# Patient Record
Sex: Female | Born: 1975
Health system: Southern US, Community
[De-identification: ages and names within clinical notes are randomized; demographics above are authoritative.]

## PROBLEM LIST (undated history)

## (undated) DIAGNOSIS — Z8481 Family history of carrier of genetic disease: Secondary | ICD-10-CM

## (undated) DIAGNOSIS — Z800 Family history of malignant neoplasm of digestive organs: Secondary | ICD-10-CM

## (undated) DIAGNOSIS — Z803 Family history of malignant neoplasm of breast: Secondary | ICD-10-CM

## (undated) DIAGNOSIS — Z8042 Family history of malignant neoplasm of prostate: Secondary | ICD-10-CM

## (undated) DIAGNOSIS — Z1501 Genetic susceptibility to malignant neoplasm of breast: Secondary | ICD-10-CM

## (undated) DIAGNOSIS — J189 Pneumonia, unspecified organism: Secondary | ICD-10-CM

## (undated) DIAGNOSIS — R112 Nausea with vomiting, unspecified: Secondary | ICD-10-CM

## (undated) DIAGNOSIS — K219 Gastro-esophageal reflux disease without esophagitis: Secondary | ICD-10-CM

## (undated) DIAGNOSIS — Z9889 Other specified postprocedural states: Secondary | ICD-10-CM

## (undated) DIAGNOSIS — Z973 Presence of spectacles and contact lenses: Secondary | ICD-10-CM

## (undated) HISTORY — DX: Family history of carrier of genetic disease: Z84.81

## (undated) HISTORY — DX: Family history of malignant neoplasm of breast: Z80.3

## (undated) HISTORY — DX: Genetic susceptibility to malignant neoplasm of breast: Z15.01

## (undated) HISTORY — DX: Family history of malignant neoplasm of digestive organs: Z80.0

## (undated) HISTORY — DX: Family history of malignant neoplasm of prostate: Z80.42

---

## 1998-10-04 ENCOUNTER — Encounter: Admission: RE | Admit: 1998-10-04 | Discharge: 1998-11-01 | Payer: Self-pay | Admitting: Family Medicine

## 1998-11-04 ENCOUNTER — Encounter: Admission: RE | Admit: 1998-11-04 | Discharge: 1999-02-02 | Payer: Self-pay | Admitting: *Deleted

## 1998-11-22 ENCOUNTER — Emergency Department (HOSPITAL_COMMUNITY): Admission: EM | Admit: 1998-11-22 | Discharge: 1998-11-23 | Payer: Self-pay | Admitting: Emergency Medicine

## 1998-11-23 ENCOUNTER — Encounter: Payer: Self-pay | Admitting: Emergency Medicine

## 1999-02-02 ENCOUNTER — Other Ambulatory Visit: Admission: RE | Admit: 1999-02-02 | Discharge: 1999-02-02 | Payer: Self-pay | Admitting: Obstetrics

## 1999-02-02 ENCOUNTER — Inpatient Hospital Stay (HOSPITAL_COMMUNITY): Admission: AD | Admit: 1999-02-02 | Discharge: 1999-02-02 | Payer: Self-pay | Admitting: Obstetrics

## 1999-02-16 ENCOUNTER — Inpatient Hospital Stay (HOSPITAL_COMMUNITY): Admission: AD | Admit: 1999-02-16 | Discharge: 1999-02-16 | Payer: Self-pay | Admitting: *Deleted

## 1999-03-23 ENCOUNTER — Observation Stay (HOSPITAL_COMMUNITY): Admission: AD | Admit: 1999-03-23 | Discharge: 1999-03-24 | Payer: Self-pay | Admitting: Obstetrics

## 1999-03-23 ENCOUNTER — Encounter: Payer: Self-pay | Admitting: Obstetrics

## 1999-04-12 ENCOUNTER — Inpatient Hospital Stay (HOSPITAL_COMMUNITY): Admission: AD | Admit: 1999-04-12 | Discharge: 1999-04-12 | Payer: Self-pay | Admitting: Obstetrics

## 1999-04-27 ENCOUNTER — Inpatient Hospital Stay (HOSPITAL_COMMUNITY): Admission: AD | Admit: 1999-04-27 | Discharge: 1999-04-27 | Payer: Self-pay | Admitting: Obstetrics

## 1999-05-08 ENCOUNTER — Inpatient Hospital Stay (HOSPITAL_COMMUNITY): Admission: AD | Admit: 1999-05-08 | Discharge: 1999-05-08 | Payer: Self-pay | Admitting: Obstetrics

## 1999-05-10 ENCOUNTER — Inpatient Hospital Stay (HOSPITAL_COMMUNITY): Admission: AD | Admit: 1999-05-10 | Discharge: 1999-05-10 | Payer: Self-pay | Admitting: Obstetrics

## 1999-05-19 ENCOUNTER — Inpatient Hospital Stay (HOSPITAL_COMMUNITY): Admission: AD | Admit: 1999-05-19 | Discharge: 1999-05-21 | Payer: Self-pay | Admitting: Obstetrics

## 1999-07-28 ENCOUNTER — Encounter: Admission: RE | Admit: 1999-07-28 | Discharge: 1999-10-26 | Payer: Self-pay | Admitting: Family Medicine

## 1999-09-05 ENCOUNTER — Encounter: Payer: Self-pay | Admitting: Family Medicine

## 1999-09-05 ENCOUNTER — Ambulatory Visit (HOSPITAL_COMMUNITY): Admission: RE | Admit: 1999-09-05 | Discharge: 1999-09-05 | Payer: Self-pay | Admitting: Family Medicine

## 2000-03-11 ENCOUNTER — Other Ambulatory Visit: Admission: RE | Admit: 2000-03-11 | Discharge: 2000-03-11 | Payer: Self-pay | Admitting: Obstetrics

## 2000-04-12 ENCOUNTER — Encounter: Payer: Self-pay | Admitting: *Deleted

## 2000-04-12 ENCOUNTER — Inpatient Hospital Stay (HOSPITAL_COMMUNITY): Admission: EM | Admit: 2000-04-12 | Discharge: 2000-04-12 | Payer: Self-pay | Admitting: Obstetrics

## 2000-04-12 ENCOUNTER — Ambulatory Visit (HOSPITAL_COMMUNITY): Admission: RE | Admit: 2000-04-12 | Discharge: 2000-04-12 | Payer: Self-pay | Admitting: *Deleted

## 2000-05-10 ENCOUNTER — Encounter: Payer: Self-pay | Admitting: Obstetrics

## 2000-05-10 ENCOUNTER — Inpatient Hospital Stay (HOSPITAL_COMMUNITY): Admission: AD | Admit: 2000-05-10 | Discharge: 2000-05-10 | Payer: Self-pay | Admitting: Obstetrics

## 2000-05-25 ENCOUNTER — Observation Stay (HOSPITAL_COMMUNITY): Admission: AD | Admit: 2000-05-25 | Discharge: 2000-05-26 | Payer: Self-pay | Admitting: Obstetrics

## 2000-09-26 ENCOUNTER — Inpatient Hospital Stay (HOSPITAL_COMMUNITY): Admission: AD | Admit: 2000-09-26 | Discharge: 2000-09-26 | Payer: Self-pay | Admitting: Obstetrics

## 2001-02-07 ENCOUNTER — Encounter: Admission: RE | Admit: 2001-02-07 | Discharge: 2001-05-08 | Payer: Self-pay | Admitting: Family Medicine

## 2001-04-11 ENCOUNTER — Encounter: Payer: Self-pay | Admitting: Family Medicine

## 2001-04-11 ENCOUNTER — Encounter: Admission: RE | Admit: 2001-04-11 | Discharge: 2001-04-11 | Payer: Self-pay | Admitting: Family Medicine

## 2001-05-06 ENCOUNTER — Other Ambulatory Visit: Admission: RE | Admit: 2001-05-06 | Discharge: 2001-05-06 | Payer: Self-pay | Admitting: Family Medicine

## 2001-06-13 ENCOUNTER — Encounter: Admission: RE | Admit: 2001-06-13 | Discharge: 2001-09-11 | Payer: Self-pay | Admitting: Family Medicine

## 2019-08-28 DIAGNOSIS — C801 Malignant (primary) neoplasm, unspecified: Secondary | ICD-10-CM

## 2019-08-28 HISTORY — DX: Malignant (primary) neoplasm, unspecified: C80.1

## 2020-04-18 ENCOUNTER — Telehealth: Payer: Self-pay | Admitting: Genetic Counselor

## 2020-04-18 NOTE — Telephone Encounter (Signed)
Received a genetic counseling referral from Dr. Donne Hazel for fhx of brca and brca1+. Pamela [name] has been cld and scheduled for a mychart video visit on 9/2 at [place] link has been sent to the pt to sign up for mychart in order to access the link to the visit.

## 2020-04-28 ENCOUNTER — Inpatient Hospital Stay: Payer: Self-pay | Attending: General Surgery | Admitting: Genetic Counselor

## 2020-04-28 ENCOUNTER — Encounter: Payer: Self-pay | Admitting: Genetic Counselor

## 2020-04-28 DIAGNOSIS — Z803 Family history of malignant neoplasm of breast: Secondary | ICD-10-CM

## 2020-04-28 DIAGNOSIS — Z8481 Family history of carrier of genetic disease: Secondary | ICD-10-CM

## 2020-04-28 DIAGNOSIS — Z1509 Genetic susceptibility to other malignant neoplasm: Secondary | ICD-10-CM

## 2020-04-28 DIAGNOSIS — Z1501 Genetic susceptibility to malignant neoplasm of breast: Secondary | ICD-10-CM

## 2020-04-28 DIAGNOSIS — Z800 Family history of malignant neoplasm of digestive organs: Secondary | ICD-10-CM

## 2020-04-28 DIAGNOSIS — Z8042 Family history of malignant neoplasm of prostate: Secondary | ICD-10-CM

## 2020-04-28 DIAGNOSIS — Z1502 Genetic susceptibility to malignant neoplasm of ovary: Secondary | ICD-10-CM

## 2020-04-28 NOTE — Progress Notes (Signed)
REFERRING PROVIDER: Rolm Bookbinder, MD [address] [place],  [place] [address]  PRIMARY PROVIDER:  No primary care provider on file.  PRIMARY REASON FOR VISIT:  1. 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 Family history of BRCA1 gene positive   3. Family history of breast cancer   4. Family history of prostate cancer   5. Family history of colon cancer      I connected with Pamela [name] on 04/28/2020 at 10:00 am EDT by video conference and verified that I am speaking with the correct person using two identifiers.   Patient location: Car (not driving) Provider location: [facility] office  HISTORY OF PRESENT ILLNESS:   Pamela [name], a 44 y.o. female, was seen for a [facility] cancer genetics consultation at the request of Dr. Donne Hazel due to a personal and family history of BRCA1 positive genetic testing.  Pamela [name] presents to clinic today to discuss her genetic test results, and to further clarify her future cancer risks as well as potential cancer risks for family members.   Pamela [name] does not have a personal history of cancer. In August of 2021, she underwent genetic testing through Dr. Donne Hazel for the known familial BRCA1 variant.  RISK FACTORS:  Menarche was at age 53-13.  First live birth at age 23.  OCP use for approximately 5 years.  Ovaries intact: yes.  Hysterectomy: no.  Menopausal status: perimenopausal.  HRT use: 0 years. Colonoscopy: n/a. Mammogram within the last year: yes. Number of breast biopsies: 2 - benign. Up to date with pelvic exams: yes. Any excessive radiation exposure in the past: no   Past Medical History:  Diagnosis Date  . 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 Family history of BRCA1 gene positive   . Family history of breast cancer   . Family history of colon cancer   . Family history of prostate cancer     Social History   Socioeconomic History  . Marital status: Not on file    Spouse name: Not on file  . Number of children:  Not on file  . Years of education: Not on file  . Highest education level: Not on file  Occupational History  . Not on file  Tobacco Use  . Smoking status: Not on file  Substance and Sexual Activity  . Alcohol use: Not on file  . Drug use: Not on file  . Sexual activity: Not on file  Other Topics Concern  . Not on file  Social History Narrative  . Not on file   Social Determinants of Health   Financial Resource Strain:   . Difficulty of Paying Living Expenses: Not on file  Food Insecurity:   . Worried About Charity fundraiser in the Last Year: Not on file  . Ran Out of Food in the Last Year: Not on file  Transportation Needs:   . Lack of Transportation (Medical): Not on file  . Lack of Transportation (Non-Medical): Not on file  Physical Activity:   . Days of Exercise per Week: Not on file  . Minutes of Exercise per Session: Not on file  Stress:   . Feeling of Stress : Not on file  Social Connections:   . Frequency of Communication with Friends and Family: Not on file  . Frequency of Social Gatherings with Friends and Family: Not on file  . Attends Religious Services: Not on file  . Active Member of Clubs or Organizations:  Not on file  . Attends Archivist Meetings: Not on file  . Marital Status: Not on file     FAMILY HISTORY:  We obtained a detailed, 4-generation family history.  Significant diagnoses are listed below: Family History  Problem Relation Age of Onset  . Breast cancer Mother        dx. in her 42s  . Breast cancer Sister 14       BRCA1 positive: c.213-11T>G (Intronic)  . Cancer Maternal Aunt        unconfirmed, patient suspects that she died from cancer younger than 7  . Breast cancer Maternal Grandmother        bilateral  . Cancer Maternal Grandmother        gynecologic  . Prostate cancer Maternal Uncle        dx. in his 35s  . Colon cancer Paternal Aunt        died in her early 39s  . Breast cancer Cousin 28       maternal first  cousin  . Breast cancer Cousin        dx. in her 40s, paternal first cousin   Pamela [name] has two sons (ages 47 and 87) and one daughter (age 9). She has one sister, Pamela [name], who was diagnosed with breast cancer earlier this year at the age of 12 and subsequently had positive genetic testing for a mutation in the BRCA1 gene.   Pamela [name] mother is 37 and was diagnosed with breast cancer in her 81s. She believes her mother had a complete hysterectomy. Pamela [name] had one maternal aunt and three maternal uncles. Her aunt died when she was younger than 80, and Pamela [name] believes that she likely had cancer and did not disclose the diagnosis to her family. This aunt had a daughter who was diagnosed with breast cancer at age 59. One of Pamela [name]'s maternal uncles was diagnosed with prostate cancer in his 94s. Her maternal grandmother had a history of bilateral breast cancer and a gynecologic cancer, diagnosed at unknown ages. Her maternal grandfather died when he was younger than 89.   Pamela [name] father is 29 and has not had cancer. Pamela [name] had two paternal aunts. One aunt is in her 61s and has not had cancer, and the other aunt died in her 83s from colon cancer. She has a paternal first cousin who was diagnosed with breast cancer in her 28s. Her paternal grandmother died at the age of 66, and her paternal grandfather died at an unknown age.  Pamela [name] is aware of previous family history of genetic testing for hereditary cancer risks. Patient's maternal ancestors are of Black and Native American descent, and paternal ancestors are of Native [place], [place], and Black descent. There is no reported Ashkenazi Jewish ancestry. There is no known consanguinity.  GENETIC TESTING:  Ms. Stamant pursued genetic testing for the specific BRCA1 mutation that was identified in the family through Dr. Donne Hazel. Genetic testing reported on 04/12/2020 through [facility]. A single, heterozygous pathogenic  variant was detected in the BRCA1 gene called c.213-11T>G (Intronic).   The test report will be scanned into EPIC and located under the Molecular Pathology section of the Results Review tab.  A portion of the result report is included below for reference.     CANCER RISKS: Studies show that women with a BRCA1 mutation can have a 65-79% lifetime risk to develop breast cancer, a 40% lifetime risk to develop a  secondary breast cancer, and up to a 36-53% risk to develop ovarian cancer. Men have a 1-2% lifetime risk to develop female breast cancer and an increased risk for prostate cancer. Both men and women can also have a 2-3% increased risk for pancreatic cancer.  CANCER RISK REDUCTION & SCREENING RECOMMENDATIONS: The [place] (NCCN) recommends the following for women who carry BRCA mutations: . Breast awareness starting at the age of 73 years; women should report any changes to their breasts to their health care provider. Periodic breast self-exams may facilitate breast self-awareness. . Clinical breast exams every 6-12 months, starting at age 45 years . Annual breast MRI and annual mammograms starting at age 4 years and 30 years, respectively, or individualized based on age of earliest onset of breast cancer in the family. . Consideration of risk reducing mastectomy, which reduces the risk of breast cancer by greater than 90%. . Consideration of risk reducing salpingo-oophorectomy (RRSO), ideally between the ages of 74-40, or individualized based on completion of child-bearing years or age of earliest onset of ovarian cancer in the family. RRSO reduces the risk of ovarian cancer by greater than 90% and can reduce the risk of breast cancer by 50% if performed prior to menopause. Women who have undergone risk reducing RRSO have a small risk of peritoneal carcinoma and can consider annual CA-125 surveillance. . For women who still have their ovaries, transvaginal ultrasound and  CA-125 testing every 6 months starting at age 15 years, or 5-10 years prior to the earliest age of onset of ovarian cancer in the family can be considered. Studies have not demonstrated that ovarian cancer screening is effective in detecting early ovarian cancer. . Consideration of chemoprevention options such as oral contraceptives, Tamoxifen and Raloxifene, if appropriate for an individual. . Consideration of pancreatic cancer screening if there is a family history of pancreatic cancer  As discussed with Pamela [name], to reduce the risk for breast cancer, prophylactic bilateral mastectomy is the most effective option for risk reduction. However, for women who choose to keep their breasts intensified screening is equally safe. Pamela [name] is highly interested in having a prophylactic bilateral mastectomy and will plan to follow up with Dr. Rolm Bookbinder to discuss surgical options.  To reduce the risk for ovarian cancer, we recommend Pamela [name] have a prophylactic bilateral salpingo-oophorectomy when childbearing is completed, if planned. We discussed that screening with CA-125 blood tests and transvaginal ultrasounds can be done twice per year. However, these tests have not been shown to detect ovarian cancer at an early stage.  Ms. Creely will follow up with her gynecologist, Dr. Michelle Nasuti, in regards to her ovarian cancer risk.   The use of oral contraceptives can lower the risk for ovarian cancer, and, per case control studies, does not significantly increase the risk for breast cancer in BRCA patients.  Case control studies have shown that oral contraceptives can lower the risk for ovarian cancer in women with BRCA mutations. Additionally, a more recent meta-analysis, including one corhort (n=3,181) and one case control study (1,096 cases and 2,878 controls) also showed an inverse correlation between ovarian cancer and ever having used oral contraceptives (OR, 0.58; 95% CI = 0.46-0.73).  Studies on  oral contraceptives and breast cancer have been conflicting, with some studies suggesting that there is not an increased risk for breast cancer in BRCA mutation carriers, while others suggest that there could be a risk.  That said, two meta-analysis studies have shown that there is  not an increased risk for breast cancer with oral contraceptive use in BRCA1 and BRCA2 carriers.    In individuals who have a prophylactic bilateral salpino-oophorectomy (BSO), the risk for breast cancer may be reduced by up to 50%.  It has been reported that short term hormone replacement therapy in women undergoing prophylactic BSO does not negate the reduction of breast cancer risk associated with surgery (NCCN Guidelines, Genetic/Familial High-Risk Assessment: Breast, Ovarian, and Pancreatic Version 1.2022).  FAMILY MEMBERS: It is important that all of Ms. Biello's relatives (both men and women) know of the presence of this gene mutation. Site-specific genetic testing can sort out who in the family is at risk and who is not.   Ms. Belk children have a 50% chance to have inherited this mutation. We recommend they have genetic counseling and testing for this mutation when they are in their early 20s, as identifying the presence of this mutation would allow them to also take advantage of risk-reducing measures.   SUPPORT AND RESOURCES: If Ms. Parrott is interested in BRCA-specific information and support, there are two groups, Facing Our Risk (www.facingourrisk.com) and Bright Pink (www.brightpink.org) which some people have found useful. They provide opportunities to speak with other individuals from high-risk families. To locate genetic counselors in other cities, visit the website of the [employer] (ArtistMovie.se) and Secretary/administrator for a Social worker by zip code.  We encouraged Ms. Greb to remain in contact with [place] on an annual basis so we can update her personal and family histories, and let her know of  advances in cancer genetics that may benefit the family. Our contact number was provided. Ms. Thier questions were answered to her satisfaction today, and she knows she is welcome to call anytime with additional questions.   Clint Guy, [place], [facility] Licensed, Certified Dispensing [email or web address] Phone: [phone]  The patient was seen for a total of 60 minutes in face-to-face genetic counseling.

## 2020-04-29 ENCOUNTER — Encounter: Payer: Self-pay | Admitting: Genetic Counselor

## 2020-09-13 NOTE — Assessment & Plan Note (Signed)
BRCA1 mutation: I discussed with the patient that BRCA1 is a tumor suppressor gene which helps repair damaged DNA. In patients with BRCA1 or 2 mutations, the damaged DNA could not be repaired properly increasing the risk of cancers. These mutations are inherited in autosomal dominant fashion and hence 50% probability that their children may have a BRCA mutation.  Cancer risk:  Breast - 72 percent Ovarian - 44 percent  Other gynecological malignancies: 1.  Fallopian tube cancers: 0.6% 2. primary peritoneal cancer: 1.3% 3.  Endometrial cancer: 1.9% 4.  Pancreatic cancer: 1% 5.  Prostate cancer sent female: 9% Slight increase in colorectal cancers, stomach and biliary cancers have been reported although the absolute risk has not been quantified.  BRCA1 patients are not at risk for melanoma.  Breast cancer risk reduction/surveillance: 1. Annual mammogram and breast MRI are recommended by NCCN starting at age of 45.  2. Chemoprevention with tamoxifen-like agents is not entirely clear.  3. After childbearing, evaluation for prophylactic bilateral mastectomy is an option. 4. Oophorectomy self reduces the risk of breast cancer by 50 all 5. Breast self-examinations starting at age 69   Ovarian cancer risk reduction: 1. Risk reducing bilateral salpingo-oophorectomy between the ages of 47-40 once childbearing is complete is recommended. In one study that was 72% reduction of risk of ovarian cancer and a decrease in breast cancer by approximately 50% 2. There is no clear data for surveillance with CA125 or vaginal ultrasounds although routinely done in practice.  3. Oral contraception dose may be protective against ovarian cancer but it may slightly increase risk of breast cancer.

## 2020-09-15 NOTE — Progress Notes (Signed)
[place] CONSULT NOTE  Patient Care Team: System, Provider Not In as PCP - General Mauro Kaufmann, RN as Oncology Nurse Navigator Rockwell Germany, RN as Oncology Nurse Navigator  CHIEF COMPLAINTS/PURPOSE OF CONSULTATION:  Newly diagnosed breast cancer  HISTORY OF PRESENTING ILLNESS:  Pamela [name] 45 y.o. female is here because of recent diagnosis of breast cancer and BRCA1 mutation. Patient palpated a right axillary mass. Diagnostic mammogram on 09/06/20 showed a 1.4cm mass in the left breast at the 4 o'clock position consistent with a cyst, and a 0.7cm mass in the mid-axilla of the right breast. 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 She has a family history of breast cancer in her sister who is also BRCA1 positive. She presents to the clinic today for initial evaluation and discussion of treatment options.  In March 2021: Patient underwent breast MRI at [facility] which did not reveal any abnormalities.  I reviewed her records extensively and collaborated the history with the patient.  SUMMARY OF ONCOLOGIC HISTORY: MEDICAL HISTORY:  Past Medical History:  Diagnosis Date  . 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 Family history of BRCA1 gene positive   . Family history of breast cancer   . Family history of colon cancer   . Family history of prostate cancer     SURGICAL HISTORY: No prior surgeries SOCIAL HISTORY: FAMILY HISTORY: Family History  Problem Relation Age of Onset  . Breast cancer Mother        dx. in her 53s  . Breast cancer Sister 47       BRCA1 positive: c.213-11T>G (Intronic)  . Cancer Maternal Aunt        unconfirmed, patient suspects that she died from cancer younger than 63  . Breast cancer Maternal Grandmother        bilateral  . Cancer Maternal Grandmother        gynecologic  . Prostate cancer Maternal Uncle        dx. in his 50s  . Colon cancer Paternal Aunt         died in her early 60s  . Breast cancer Cousin 17       maternal first cousin  . Breast cancer Cousin        dx. in her 16s, paternal first cousin    ALLERGIES:  has no allergies on file.  MEDICATIONS:  No current outpatient medications on file.   No current facility-administered medications for this visit.    REVIEW OF SYSTEMS:    Breast: Palpable right axilla mass All other systems were reviewed with the patient and are negative.  PHYSICAL EXAMINATION: ECOG PERFORMANCE STATUS: 1 - Symptomatic but completely ambulatory  There were no vitals filed for this visit. There were no vitals filed for this visit.  RADIOGRAPHIC STUDIES: I have personally reviewed the radiological reports and agreed with the findings in the report.  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 In patients with BRCA1 or 2 mutations, the damaged DNA could not be repaired properly increasing the risk of cancers. These mutations are inherited in autosomal dominant fashion and hence 50% probability that their children may have a BRCA mutation.  Cancer risk:  Breast - 72 percent Ovarian - 44 percent  Other gynecological malignancies: 1.  Fallopian  tube cancers: 0.6% 2. primary peritoneal cancer: 1.3% 3.  Endometrial cancer: 1.9% 4.  Pancreatic cancer: 1% 5.  Prostate cancer sent female: 9% Slight increase in colorectal cancers, stomach and biliary cancers have been reported although the absolute risk has not been quantified.  BRCA1 patients are not at risk for melanoma.  Breast cancer risk reduction/surveillance: 1. Annual mammogram and breast MRI are recommended by NCCN starting at age of 17.  2. Chemoprevention with tamoxifen-like agents is not entirely clear.  3. After childbearing, evaluation for prophylactic bilateral mastectomy is an option. 4. Oophorectomy self  reduces the risk of breast cancer by 50 all 5. Breast self-examinations starting at age 76   Ovarian cancer risk reduction: 1. Risk reducing bilateral salpingo-oophorectomy between the ages of 19-40 once childbearing is complete is recommended. In one study that was 72% reduction of risk of ovarian cancer and a decrease in breast cancer by approximately 50% 2. There is no clear data for surveillance with CA125 or vaginal ultrasounds although routinely done in practice.  3. Oral contraception dose may be protective against ovarian cancer but it may slightly increase risk of breast cancer.   Malignant neoplasm of lower-inner quadrant of right breast of female, estrogen receptor positive ([place]) 09/07/2020:Palpable right axillary mass.  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 After reviewing the pathology in detail, we proceeded to discuss the different treatment options between surgery, radiation, chemotherapy, antiestrogen therapies.  Recommendations: 1. Breast conserving surgery with sentinel lymph node biopsy versus bilateral mastectomies and reconstruction 2. Oncotype DX testing to determine if chemotherapy would be of any benefit followed by 3. Adjuvant radiation therapy if she undergoes lumpectomy 4. Adjuvant antiestrogen therapy  Oncotype counseling: I discussed Oncotype DX test. I explained to the patient that this is a 21 gene panel to evaluate patient tumors DNA to calculate recurrence score. This would help determine whether patient has high risk or low risk breast cancer. She understands that if her tumor was found to be high  risk, she would benefit from systemic chemotherapy. If low risk, no need of chemotherapy.  BRCA1 mutation: I discussed with the patient that BRCA1 is a tumor suppressor gene which helps repair damaged DNA. In patients with BRCA1 or 2 mutations, the damaged DNA could not be repaired properly increasing the risk of cancers. These mutations are inherited in autosomal dominant fashion and hence 50% probability that their children may have a BRCA mutation.  Cancer risk:  Breast - 72 percent Ovarian - 44 percent  Other gynecological malignancies: 1.  Fallopian tube cancers: 0.6% 2. primary peritoneal cancer: 1.3% 3.  Endometrial cancer: 1.9% 4.  Pancreatic cancer: 1% 5.  Prostate cancer sent female: 9% Slight increase in colorectal cancers, stomach and biliary cancers have been reported although the absolute risk has not been quantified.  BRCA1 patients are not at risk for melanoma.  Cancer risk reduction/surveillance: 1.  Bilateral mastectomies  2. bilateral salpingo-oophorectomy: I sent a referral to Dr. Denman George 3.  GI consult for pancreatic cancer surveillance  Her sister is Herold Harms who was treated previously for breast cancer on also had a BRCA1 mutation.  Return to clinic after surgery  to discuss final pathology report and then determine if Oncotype DX testing will need to be sent.     All questions were answered. The patient knows to call the clinic with any problems, questions or concerns.   Rulon Eisenmenger, MD, MPH 09/16/2020    I, Molly Dorshimer, am acting as scribe for Nicholas Lose, MD.  I have reviewed the above documentation for accuracy and completeness, and I agree with the above.

## 2020-09-16 ENCOUNTER — Telehealth: Payer: Self-pay | Admitting: *Deleted

## 2020-09-16 ENCOUNTER — Other Ambulatory Visit: Payer: Self-pay | Admitting: General Surgery

## 2020-09-16 ENCOUNTER — Other Ambulatory Visit: Payer: Self-pay

## 2020-09-16 ENCOUNTER — Inpatient Hospital Stay: Payer: BC Managed Care – PPO | Attending: Hematology and Oncology | Admitting: Hematology and Oncology

## 2020-09-16 ENCOUNTER — Encounter: Payer: Self-pay | Admitting: *Deleted

## 2020-09-16 DIAGNOSIS — Z803 Family history of malignant neoplasm of breast: Secondary | ICD-10-CM | POA: Insufficient documentation

## 2020-09-16 DIAGNOSIS — Z170 Estrogen receptor positive status [ER+]: Secondary | ICD-10-CM | POA: Insufficient documentation

## 2020-09-16 DIAGNOSIS — Z1501 Genetic susceptibility to malignant neoplasm of breast: Secondary | ICD-10-CM

## 2020-09-16 DIAGNOSIS — C50311 Malignant neoplasm of lower-inner quadrant of right female breast: Secondary | ICD-10-CM | POA: Insufficient documentation

## 2020-09-16 DIAGNOSIS — Z8042 Family history of malignant neoplasm of prostate: Secondary | ICD-10-CM | POA: Insufficient documentation

## 2020-09-16 DIAGNOSIS — Z1502 Genetic susceptibility to malignant neoplasm of ovary: Secondary | ICD-10-CM | POA: Diagnosis not present

## 2020-09-16 DIAGNOSIS — Z1509 Genetic susceptibility to other malignant neoplasm: Secondary | ICD-10-CM | POA: Diagnosis not present

## 2020-09-16 DIAGNOSIS — Z800 Family history of malignant neoplasm of digestive organs: Secondary | ICD-10-CM | POA: Diagnosis not present

## 2020-09-16 DIAGNOSIS — Z148 Genetic carrier of other disease: Secondary | ICD-10-CM | POA: Diagnosis not present

## 2020-09-16 DIAGNOSIS — C50411 Malignant neoplasm of upper-outer quadrant of right female breast: Secondary | ICD-10-CM

## 2020-09-16 NOTE — Assessment & Plan Note (Addendum)
09/07/2020:Palpable right axillary mass.  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 After reviewing the pathology in detail, we proceeded to discuss the different treatment options between surgery, radiation, chemotherapy, antiestrogen therapies.  Recommendations: 1. Breast conserving surgery with sentinel lymph node biopsy versus bilateral mastectomies and reconstruction 2. Oncotype DX testing to determine if chemotherapy would be of any benefit followed by 3. Adjuvant radiation therapy if she undergoes lumpectomy 4. Adjuvant antiestrogen therapy  Oncotype counseling: I discussed Oncotype DX test. I explained to the patient that this is a 21 gene panel to evaluate patient tumors DNA to calculate recurrence score. This would help determine whether patient has high risk or low risk breast cancer. She understands that if her tumor was found to be high risk, she would benefit from systemic chemotherapy. If low risk, no need of chemotherapy.  BRCA1 mutation: I discussed with the patient that BRCA1 is a tumor suppressor gene which helps repair damaged DNA. In patients with BRCA1 or 2 mutations, the damaged DNA could not be repaired properly increasing the risk of cancers. These mutations are inherited in autosomal dominant fashion and hence 50% probability that their children may have a BRCA mutation.  Cancer risk:  Breast - 72 percent Ovarian - 44 percent  Other gynecological malignancies: 1.  Fallopian tube cancers: 0.6% 2. primary peritoneal cancer: 1.3% 3.  Endometrial cancer: 1.9% 4.  Pancreatic cancer: 1% 5.  Prostate cancer sent female:  9% Slight increase in colorectal cancers, stomach and biliary cancers have been reported although the absolute risk has not been quantified.  BRCA1 patients are not at risk for melanoma.  Cancer risk reduction/surveillance: 1.  Bilateral mastectomies versus annual breast MRIs 2. bilateral salpingo-oophorectomy  3.  GI consult for pancreatic cancer surveillance  Return to clinic after surgery to discuss final pathology report and then determine if Oncotype DX testing will need to be sent.

## 2020-09-16 NOTE — Telephone Encounter (Signed)
Spoke to pt, provided navigation resources and contact information. Denies questions or concerns regarding dx or treatment care plan. Encourage pt to call with needs. Received verbal understanding.

## 2020-09-19 ENCOUNTER — Encounter: Payer: Self-pay | Admitting: *Deleted

## 2020-09-19 NOTE — H&P (Signed)
Subjective:     Patient ID: Pamela [name] is a 45 y.o. female.  HPI  Here for follow up discussion breast reconstruction. 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 We have previously discussed risk reducing NSM with immediate expander placement. Since last visit here, palpated right breast mass. Diagnostic MMG 09/06/20 showed a 1.4 cm mass in the left breast at the 4 o'clock position consistent with a cyst, and a 0.7 cm mass in the mid-axilla of the right breast. Biopsy demonstrated IDC with DCIS, ER/PR+, Her2-.   Plan Oncotype.  MRI 10/2019 "Asymmetric enlargement of the left axillary lymph nodes compared to the right may be reactive to recent left breast biopsy." 6 week follow [place] recommended.  Mother and MGM also with breast ca. No family members have had reconstruction to date. Sister that accompanies her received RT.  Current 34 B, desires bit larger. Wt stable. Lives with spouse and kids ages 88 and 46. Works as Surveyor, mining for [employer].  Review of Systems Remainder 12 point review negative    Objective:   Physical Exam Cardiovascular:     Rate and Rhythm: Normal rate and regular rhythm.     Heart sounds: Normal heart sounds.  Pulmonary:     Effort: Pulmonary effort is normal.     Breath sounds: Normal breath sounds.  Chest:  Breasts:     Right: No axillary adenopathy.     Left: No axillary adenopathy.    Abdominal:     Comments: Minimal soft tissue for reconstruction  Lymphadenopathy:     Upper Body:     Right upper body: No axillary adenopathy.     Left upper body: No axillary adenopathy.  Skin:    Comments: Fitzpatrick 5  Neurological:     Mental Status: She is oriented to person, place, and time.    Pseudoptosis/grade 1 ptosis bilateral Right breast UOQ axillary tail SN to nipple R 23 L 23 cm BW R 18 L 18 cm BW 13 cm Nipple to IMF R 9 L 8 cm    Assessment:     Right breast cancer UOQ ER+ BRCA1     Plan:     Plan bilateral nipple sparing mastectomy with immediate expander acellular dermis reconstruction.  Reviewedreconstructionwill be asensate and not stimulate. Reviewed risks mastectomy flap necrosis requiring additional surgery.  Discussed use of acellular dermis in reconstruction, cadaveric source, incorporation over several weeks, risk that if has seroma or infection can act as additional nidus for infection if not incorporated.Reviewed this is an off label use of acellular dermis.  Reviewedprepectoral vs sub pectoral reconstruction. Discussed with patient and benefit of this is no animation deformity, may be less pain. Risk may be more visible rippling over upper poles, greater need of ADM. Reviewed pre pectoral would require larger amount acellular dermis, more drains. Discussed any type reconstruction also risks long term displacement implant and visible rippling. If prepectoral counseled I would recommend she be comfortable with silicone implants as more options that have less rippling. [place] to prepectoral placement.  Additional risks including but not limited to bleeding, seroma, hematoma, damage to adjacent structures, need for additional procedures, blood clots in legs or lungs reviewed.  Reviewed overnight stay, drains, dressings, and post op limitations. Notes she developed rash post thyroid surgery in which tissue glue and tegaderm used. Developed rash post recent biopsy- notes CHG prep used. Will plan not to use alternative prep, no Dermabond. If she still has rash, can remove  Tegaderm dressings herself.  Discussed risk COVID infectionthrough this elective surgery. Patient will receive COVID testing prior to surgery. Discussed even if patient receivesa negative test result, the tests in some cases may fail to detect the virus or patient maycontract COVID after the test.COVID 19 infectionbefore/during/aftersurgery may result in lead to a higher chance of  complication and death.

## 2020-09-22 ENCOUNTER — Encounter: Payer: Self-pay | Admitting: Licensed Clinical Social Worker

## 2020-09-22 ENCOUNTER — Telehealth: Payer: Self-pay | Admitting: *Deleted

## 2020-09-22 ENCOUNTER — Encounter: Payer: Self-pay | Admitting: *Deleted

## 2020-09-22 NOTE — Telephone Encounter (Signed)
Attempted to reach the patient to schedule a new patient appt. Left a message for the patient to call the office back.

## 2020-09-22 NOTE — Progress Notes (Signed)
[facility]  Initial Assessment   Pamela [name] is a 45 y.o. year old female contacted by phone. Clinical Social Work was referred by new patient protocol for assessment of psychosocial needs.   SDOH (Social Determinants of Health) assessments performed: No   Distress Screen completed: No No flowsheet data found.   Family/Social Information:  . Housing Arrangement: patient lives with husband and two of her children (38 & 58 yo). 3 older children (ages 69, 11, 13) outside the home . Family members/support persons in your life? Family and Friends/Colleagues . Transportation concerns: no  . Employment: Working full time. Income source: Employment. Getting paperwork for short-term disability and FMLA . Financial concerns: No o Type of concern: None . Food access concerns: no . Services Currently in place:  Working on short-term disability and FMLA paperwork  Coping/ Adjustment to diagnosis: . Patient understands treatment plan and what happens next? yes . Concerns about diagnosis and/or treatment: feels okay right now, just preparing for surgery . Current coping skills/ strengths: Capable of independent living, Motivation for treatment/growth and Supportive family/friends    SUMMARY: Current SDOH Barriers:  . No significant SDOH barriers noted today  Clinical Social Work Clinical Goal(s):  Marland Kitchen No clinical social work goals at this time  Interventions: . Discussed the importance of support during treatment . Informed patient of the support team roles and support services at [facility]. E-mailed February support calendar . Provided CSW contact information and encouraged patient to call with any questions or concerns   Follow Up Plan: Patient will contact CSW with any support or resource needs Patient verbalizes understanding of plan: Yes    Christeen Douglas , LCSW

## 2020-09-26 ENCOUNTER — Ambulatory Visit: Payer: BC Managed Care – PPO

## 2020-10-05 ENCOUNTER — Encounter (HOSPITAL_BASED_OUTPATIENT_CLINIC_OR_DEPARTMENT_OTHER): Payer: Self-pay | Admitting: General Surgery

## 2020-10-05 ENCOUNTER — Telehealth: Payer: Self-pay

## 2020-10-05 ENCOUNTER — Encounter: Payer: Self-pay | Admitting: Gynecologic Oncology

## 2020-10-05 NOTE — Telephone Encounter (Signed)
Meaningful use completed.

## 2020-10-06 ENCOUNTER — Encounter: Payer: Self-pay | Admitting: Gynecologic Oncology

## 2020-10-06 ENCOUNTER — Inpatient Hospital Stay: Payer: BC Managed Care – PPO | Attending: Hematology and Oncology | Admitting: Gynecologic Oncology

## 2020-10-06 ENCOUNTER — Other Ambulatory Visit: Payer: Self-pay

## 2020-10-06 ENCOUNTER — Ambulatory Visit: Payer: BC Managed Care – PPO | Attending: General Surgery | Admitting: Physical Therapy

## 2020-10-06 ENCOUNTER — Encounter: Payer: Self-pay | Admitting: Physical Therapy

## 2020-10-06 ENCOUNTER — Other Ambulatory Visit (HOSPITAL_COMMUNITY)
Admission: RE | Admit: 2020-10-06 | Discharge: 2020-10-06 | Disposition: A | Discharge status: Home / Self Care | Payer: BC Managed Care – PPO | Source: Ambulatory Visit | Attending: Gynecologic Oncology | Admitting: Gynecologic Oncology

## 2020-10-06 ENCOUNTER — Other Ambulatory Visit: Payer: Self-pay | Admitting: Gynecologic Oncology

## 2020-10-06 VITALS — BP 111/86 | HR 69 | Temp 97.3°F | Resp 18 | Ht 67.0 in | Wt 150.4 lb

## 2020-10-06 DIAGNOSIS — C50919 Malignant neoplasm of unspecified site of unspecified female breast: Secondary | ICD-10-CM

## 2020-10-06 DIAGNOSIS — Z170 Estrogen receptor positive status [ER+]: Secondary | ICD-10-CM

## 2020-10-06 DIAGNOSIS — F32A Depression, unspecified: Secondary | ICD-10-CM | POA: Insufficient documentation

## 2020-10-06 DIAGNOSIS — Z1509 Genetic susceptibility to other malignant neoplasm: Secondary | ICD-10-CM | POA: Insufficient documentation

## 2020-10-06 DIAGNOSIS — R293 Abnormal posture: Secondary | ICD-10-CM

## 2020-10-06 DIAGNOSIS — Z148 Genetic carrier of other disease: Secondary | ICD-10-CM | POA: Insufficient documentation

## 2020-10-06 DIAGNOSIS — Z1502 Genetic susceptibility to malignant neoplasm of ovary: Secondary | ICD-10-CM

## 2020-10-06 DIAGNOSIS — Z1501 Genetic susceptibility to malignant neoplasm of breast: Secondary | ICD-10-CM

## 2020-10-06 DIAGNOSIS — N939 Abnormal uterine and vaginal bleeding, unspecified: Secondary | ICD-10-CM

## 2020-10-06 DIAGNOSIS — Z803 Family history of malignant neoplasm of breast: Secondary | ICD-10-CM

## 2020-10-06 DIAGNOSIS — Z87891 Personal history of nicotine dependence: Secondary | ICD-10-CM | POA: Insufficient documentation

## 2020-10-06 DIAGNOSIS — Z87410 Personal history of cervical dysplasia: Secondary | ICD-10-CM | POA: Insufficient documentation

## 2020-10-06 DIAGNOSIS — C50411 Malignant neoplasm of upper-outer quadrant of right female breast: Secondary | ICD-10-CM | POA: Diagnosis present

## 2020-10-06 NOTE — Patient Instructions (Signed)
Plan to have an ultrasound now and we will contact you with the results. We will also contact you with the results of your pap smear from today.  WE WILL BRING YOU BACK CLOSER TO THE SURGERY DATE TO DISCUSS THE INSTRUCTIONS BELOW IN FURTHER DETAIL.  Preparing for your Surgery  Plan for surgery on February 22, 2021 potentially (subject to change based on treatment schedule) with Dr. Everitt Amber at [facility]. You will be scheduled for a robotic assisted total laparoscopic hysterectomy (removal of the uterus and cervix), bilateral salpingo-oophorectomy (removal of both ovaries and fallopian tubes).  Pre-operative Testing -You will receive a phone call from presurgical testing at [facility] to arrange for a pre-operative appointment, labs, and COVID test. The COVID test normally happens 3 days prior to the surgery and they ask that you self quarantine after the test up until surgery to decrease chance of exposure.  -Bring your insurance card, copy of an advanced directive if applicable, medication list  -At that visit, you will be asked to sign a consent for a possible blood transfusion in case a transfusion becomes necessary during surgery.  The need for a blood transfusion is rare but having consent is a necessary part of your care.    -You should not be taking blood thinners or aspirin at least ten days prior to surgery unless instructed by your surgeon.  -Do not take supplements such as fish oil (omega 3), red yeast rice, turmeric before your surgery. You want to avoid medications with aspirin in them including headache powders such as BC or Goody's), Excedrin migraine.  Day Before Surgery at [facility] will be asked to take in a light diet the day before surgery. You will be advised you can have clear liquids up until 3 hours before your surgery.    Eat a light diet the day before surgery.  Examples including soups, broths, toast, yogurt, mashed  potatoes.  AVOID GAS PRODUCING FOODS. Things to avoid include carbonated beverages (fizzy beverages, sodas), raw fruits and raw vegetables (uncooked), or beans.   If your bowels are filled with gas, your surgeon will have difficulty visualizing your pelvic organs which increases your surgical risks.  Your role in recovery Your role is to become active as soon as directed by your doctor, while still giving yourself time to heal.  Rest when you feel tired. You will be asked to do the following in order to speed your recovery:  - Cough and breathe deeply. This helps to clear and expand your lungs and can prevent pneumonia after surgery.  - [place]. Do mild physical activity. Walking or moving your legs help your circulation and body functions return to normal. Do not try to get up or walk alone the first time after surgery.   -If you develop swelling on one leg or the other, pain in the back of your leg, redness/warmth in one of your legs, please call the office or go to the Emergency Room to have a doppler to rule out a blood clot. For shortness of breath, chest pain-seek care in the Emergency Room as soon as possible. - Actively manage your pain. Managing your pain lets you move in comfort. We will ask you to rate your pain on a scale of zero to 10. It is your responsibility to tell your doctor or nurse where and how much you hurt so your pain can be treated.  Special Considerations -  If you are diabetic, you may be placed on insulin after surgery to have closer control over your blood sugars to promote healing and recovery.  This does not mean that you will be discharged on insulin.  If applicable, your oral antidiabetics will be resumed when you are tolerating a solid diet.  -Your final pathology results from surgery should be available around one week after surgery and the results will be relayed to you when available.  -FMLA forms can be faxed to [phone] and please allow  5-7 business days for completion.  Pain Management After Surgery -You will be prescribed your pain medication and bowel regimen medications before surgery so that you can have these available when you are discharged from the hospital. The pain medication is for use ONLY AFTER surgery and a new prescription will not be given.   -Make sure that you have Tylenol and Ibuprofen at home to use on a regular basis after surgery for pain control. We recommend alternating the medications every hour to six hours since they work differently and are processed in the body differently for pain relief.  -Review the attached handout on narcotic use and their risks and side effects.   Bowel Regimen -You will be prescribed Sennakot-S to take nightly to prevent constipation especially if you are taking the narcotic pain medication intermittently.  It is important to prevent constipation and drink adequate amounts of liquids. You can stop taking this medication when you are not taking pain medication and you are back on your normal bowel routine.  Risks of Surgery Risks of surgery are low but include bleeding, infection, damage to surrounding structures, re-operation, blood clots, and very rarely death.   Blood Transfusion Information (For the consent to be signed before surgery)  We will be checking your blood type before surgery so in case of emergencies, we will know what type of blood you would need.                                            WHAT IS A BLOOD TRANSFUSION?  A transfusion is the replacement of blood or some of its parts. Blood is made up of multiple cells which provide different functions.  Red blood cells carry oxygen and are used for blood loss replacement.  White blood cells fight against infection.  Platelets control bleeding.  Plasma helps clot blood.  Other blood products are available for specialized needs, such as hemophilia or other clotting disorders. BEFORE THE TRANSFUSION   Who gives blood for transfusions?   You may be able to donate blood to be used at a later date on yourself (autologous donation).  Relatives can be asked to donate blood. This is generally not any safer than if you have received blood from a stranger. The same precautions are taken to ensure safety when a relative's blood is donated.  Healthy volunteers who are fully evaluated to make sure their blood is safe. This is blood bank blood. Transfusion therapy is the safest it has ever been in the practice of medicine. Before blood is taken from a donor, a complete history is taken to make sure that person has no history of diseases nor engages in risky social behavior (examples are intravenous drug use or sexual activity with multiple partners). The donor's travel history is screened to minimize risk of transmitting infections, such as malaria. The donated  blood is tested for signs of infectious diseases, such as HIV and hepatitis. The blood is then tested to be sure it is compatible with you in order to minimize the chance of a transfusion reaction. If you or a relative donates blood, this is often done in anticipation of surgery and is not appropriate for emergency situations. It takes many days to process the donated blood. RISKS AND COMPLICATIONS Although transfusion therapy is very safe and saves many lives, the main dangers of transfusion include:   Getting an infectious disease.  Developing a transfusion reaction. This is an allergic reaction to something in the blood you were given. Every precaution is taken to prevent this. The decision to have a blood transfusion has been considered carefully by your caregiver before blood is given. Blood is not given unless the benefits outweigh the risks.  AFTER SURGERY INSTRUCTIONS  Return to work: 2-4 weeks if applicable  Activity: 1. Be up and out of the bed during the day.  Take a nap if needed.  You may walk up steps but be careful and use the  hand rail.  Stair climbing will tire you more than you think, you may need to stop part way and rest.   2. No lifting or straining for 6 weeks over 10 pounds. No pushing, pulling, straining for 6 weeks.  3. No driving for around 1 week(s).  Do not drive if you are taking narcotic pain medicine and make sure that your reaction time has returned.   4. You can shower as soon as the next day after surgery. Shower daily.  Use your regular soap and water (not directly on the incision) and pat your incision(s) dry afterwards; don't rub.  No tub baths or submerging your body in water until cleared by your surgeon. If you have the soap that was given to you by pre-surgical testing that was used before surgery, you do not need to use it afterwards because this can irritate your incisions.   5. No sexual activity and nothing in the vagina for 8 weeks.  6. You may experience a small amount of clear drainage from your incisions, which is normal.  If the drainage persists, increases, or changes color please call the office.  7. Do not use creams, lotions, or ointments such as neosporin on your incisions after surgery until advised by your surgeon because they can cause removal of the dermabond glue on your incisions.    8. You may experience vaginal spotting after surgery or around the 6-8 week mark from surgery when the stitches at the top of the vagina begin to dissolve.  The spotting is normal but if you experience heavy bleeding, call our office.  9. Take Tylenol or ibuprofen first for pain and only use narcotic pain medication for severe pain not relieved by the Tylenol or Ibuprofen.  Monitor your Tylenol intake to a max of 4,000 mg in a 24 hour period. You can alternate these medications after surgery.  Diet: 1. Low sodium Heart Healthy Diet is recommended but you are cleared to resume your normal (before surgery) diet after your procedure.  2. It is safe to use a laxative, such as Miralax or Colace, if  you have difficulty moving your bowels. You have been prescribed Sennakot at bedtime every evening to keep bowel movements regular and to prevent constipation.    Wound Care: 1. Keep clean and dry.  Shower daily.  Reasons to call the Doctor:  Fever - Oral temperature  greater than 100.4 degrees Fahrenheit  Foul-smelling vaginal discharge  Difficulty urinating  Nausea and vomiting  Increased pain at the site of the incision that is unrelieved with pain medicine.  Difficulty breathing with or without chest pain  New calf pain especially if only on one side  Sudden, continuing increased vaginal bleeding with or without clots.   Contacts: For questions or concerns you should contact:  Dr. Everitt Amber at [phone]  Joylene John, NP at [phone]  After Hours: call [phone] and have the GYN Oncologist paged/contacted (after 5 pm or on the weekends).  Messages sent via mychart are for non-urgent matters and are not responded to after hours so for urgent needs, please call the after hours number.

## 2020-10-06 NOTE — Therapy (Signed)
[place], [place], [address] Phone: [phone]   Fax:  [phone]  Physical Therapy Evaluation  Patient Details  Name: Pamela [name] MRN: [id number] Date of Birth: Feb 08, 1976 Referring Provider (PT): Dr. Donne Hazel   Encounter Date: 10/06/2020   PT End of Session - 10/06/20 1343    Visit Number 1    Number of Visits 2    Date for PT Re-Evaluation 12/01/20    PT Start Time 1301    PT Stop Time 1335    PT Time Calculation (min) 34 min    Activity Tolerance Patient tolerated treatment well    Behavior During Therapy [facility] for tasks assessed/performed           Past Medical History:  Diagnosis Date  . 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 Family history of BRCA1 gene positive   . Family history of breast cancer   . Family history of colon cancer   . Family history of prostate cancer     Past Surgical History:  Procedure Laterality Date  . CERVICAL DISC SURGERY     ruptured disc neck with prosthetic replacement  . GANGLION CYST EXCISION Left    left wrist  . TUBAL LIGATION      There were no vitals filed for this visit.    Subjective Assessment - 10/06/20 1257    Subjective I am doing pretty good.    Pertinent History 10/12/20 plan is to have bilateral nipple sparing mastectomy with R SLNB for treatment of R breast cancer that is ER/PR+,HER2-, BRCA 1 positive, plan is to have total hysterectomy on 02/22/21'    Patient Stated Goals to gain info from provider    Currently in Pain? No/denies    Pain Score 0-No pain              [facility] PT Assessment - 10/06/20 0001      Assessment   Medical Diagnosis R breast cancer    Referring Provider (PT) Dr. Donne Hazel    Onset Date/Surgical Date 10/12/20    Hand Dominance Left    Prior Therapy 4 or 5 years ago had surgery on neck and received PT for that      Precautions   Precautions Other (comment)    Precaution Comments active cancer       Restrictions   Weight Bearing Restrictions No      Balance Screen   Has the patient fallen in the past 6 months No    Has the patient had a decrease in activity level because of a fear of falling?  No    Is the patient reluctant to leave their home because of a fear of falling?  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   Leisure pt used to go to trainer 1x/wk, exercises 3-4x/wk stopped when diagnosed but will continue after surgery      Cognition   Overall Cognitive Status Within Functional Limits for tasks assessed      Posture/Postural Control   Posture/Postural Control Postural limitations  Postural Limitations Rounded Shoulders;Forward head      ROM / Strength   AROM / PROM / Strength AROM      AROM   AROM Assessment Site Shoulder    Right/Left Shoulder Right;Left    Right Shoulder Extension 55 Degrees    Right Shoulder Flexion 170 Degrees    Right Shoulder ABduction 175 Degrees    Right Shoulder Internal Rotation 54 Degrees    Right Shoulder External Rotation 75 Degrees    Left Shoulder Extension 55 Degrees    Left Shoulder Flexion 168 Degrees    Left Shoulder ABduction 172 Degrees    Left Shoulder Internal Rotation 60 Degrees    Left Shoulder External Rotation 69 Degrees             LYMPHEDEMA/ONCOLOGY QUESTIONNAIRE - 10/06/20 0001      Type   Cancer Type right breast cancer      Surgeries   Mastectomy Date 10/12/20      Lymphedema Assessments   Lymphedema Assessments Upper extremities      Right Upper Extremity Lymphedema   15 cm Proximal to Olecranon Process 28 cm    Olecranon Process 26.5 cm    15 cm Proximal to  Ulnar Styloid Process 23.5 cm    Just Proximal to Ulnar Styloid Process 15.4 cm    Across Hand at [employer] 18.2 cm    At [place] of 2nd Digit 5.5 cm      Left Upper Extremity Lymphedema   15 cm Proximal to Olecranon Process 27 cm    Olecranon Process 25.2 cm    15 cm Proximal to Ulnar Styloid Process 22.5 cm    Just Proximal to Ulnar Styloid Process 15 cm    Across Hand at [employer] 17.5 cm    At [place] of 2nd Digit 5.5 cm           L-DEX FLOWSHEETS - 10/06/20 1300      L-DEX LYMPHEDEMA SCREENING   Measurement Type Unilateral    L-DEX MEASUREMENT EXTREMITY Upper Extremity    POSITION  Standing    DOMINANT SIDE Left    At Risk Side Right    BASELINE SCORE (UNILATERAL) 9.6            The patient was assessed using the L-Dex machine today to produce a lymphedema index baseline score. The patient will be reassessed on a regular basis (typically every 3 months) to obtain new L-Dex scores. If the score is > 6.5 points away from his/her baseline score indicating onset of subclinical lymphedema, it will be recommended to wear a compression garment for 4 weeks, 12 hours per day and then be reassessed. If the score continues to be > 6.5 points from baseline at reassessment, we will initiate lymphedema treatment. Assessing in this manner has a 95% rate of preventing clinically significant lymphedema.       Objective measurements completed on examination: See above findings.               PT Education - 10/06/20 1341    Education Details ABC class, post op shoulder ROM exercises, anatomy and physiology of lymphatic system, signs and symptoms of lymphedema, SOZO, lymphedema risk reduction practices    Person(s) Educated Patient    Methods Explanation;Handout    Comprehension Verbalized understanding               PT Long Term Goals - 10/06/20 1347      PT LONG TERM  GOAL #1   Title Pt will return to baseline ROM measurements and not demonstrate any  signs/symptoms of lymphedema.    Time 8    Period Weeks    Status New    Target Date 12/01/20           Breast Clinic Goals - 10/06/20 1347      Patient will be able to verbalize understanding of pertinent lymphedema risk reduction practices relevant to her diagnosis specifically related to skin care.   Time 1    Period Days    Status Achieved      Patient will be able to return demonstrate and/or verbalize understanding of the post-op home exercise program related to regaining shoulder range of motion.   Time 1    Period Days    Status Achieved      Patient will be able to verbalize understanding of the importance of attending the postoperative After Breast Cancer Class for further lymphedema risk reduction education and therapeutic exercise.   Time 1    Period Days    Status Achieved                 Plan - 10/06/20 1343    Clinical Impression Statement Pt was diagnosed with R breast cancer and tested positive for BRCA 1. She will undergo a bilateral nipple sparing mastectomy on 10/12/20 with R SLNB. She plans on having a total hysterectomy on 02/22/21. It is unknown if she will require chemo or radiation. Pt was instructed in post op exercises and educated not to begin exercises until a week after the last drain is removed. Baseline ROM and SOZO measurements taken today. She will benefit from a post op PT reassessment to determine needs and in every 3 months for additional L dex screening to detect subclinical lymphedema.    Stability/Clinical Decision Making Stable/Uncomplicated    Clinical Decision Making Low    Rehab Potential Good    PT Frequency --   eval and 1 f/u   PT Duration 8 weeks    PT Treatment/Interventions ADLs/Self Care Home Management;Patient/family education;Therapeutic exercise    PT Next Visit Plan reassess baselines    PT Home Exercise Plan post op shoulder ROM    Consulted and Agree with Plan of Care Patient           Patient will benefit from  skilled therapeutic intervention in order to improve the following deficits and impairments:  Postural dysfunction,Decreased knowledge of precautions  Visit Diagnosis: Abnormal posture  Malignant neoplasm of upper-outer quadrant of right breast in female, estrogen receptor positive ([place])   Patient will follow up at outpatient cancer rehab 3-4 weeks following surgery.  If the patient requires physical therapy at that time, a specific plan will be dictated and sent to the referring physician for approval. The patient was educated today on appropriate basic range of motion exercises to begin post operatively and the importance of attending the After Breast Cancer class following surgery.  Patient was educated today on lymphedema risk reduction practices as it pertains to recommendations that will benefit the patient immediately following surgery.  She verbalized good understanding.    Problem List Patient Active Problem List   Diagnosis Date Noted  . History of cervical dysplasia 10/06/2020  . Malignant neoplasm of lower-inner quadrant of right breast of female, estrogen receptor positive ([place]) 09/16/2020  . 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 Family history of BRCA1 gene positive   . Family history of  breast cancer   . Family history of prostate cancer   . Family history of colon cancer     Manus Gunning, PT 10/06/2020, 1:48 PM  [place], [place], [address] Phone: [phone]   Fax:  [phone]  Name: Pamela [name] MRN: [id number] Date of Birth: 1976-06-13

## 2020-10-06 NOTE — Progress Notes (Signed)
Consult Note: Gyn-Onc  Consult was requested by Dr. Lindi Adie for the evaluation of Pamela [name] 45 y.o. female  CC:  Chief Complaint  Patient presents with  . BRCA positive    Assessment/Plan:  Ms. Pamela [name]  is a 45 y.o.  year old with a deleterious mutation in [place].  I counseled [name] about the implications of carrying a deleterious mutation in BRCA1 and's associated 30 to 40% lifetime risk of ovarian cancer and potentially 2% lifetime risk for endometrial cancer.  I explained that current recommendations are for risk reducing BSO and consideration of hysterectomy at or after age 52.  Alternatives to his reducing surgery would be 6 monthly ultrasounds and Ca1 25 assessments, though these are limited by poor sensitivity and specificity and lack of documented associated risk reduction in ovarian cancer development or stage shifting to more favorable prognostic stages.  Patient is interested in pursuing risk reducing hysterectomy and BSO.  I explained to the patient that she first needs to complete treatment and reconstructive surgery for her breast cancer diagnosis.  At present her planned reconstruction is for May 2022.  This may change based on findings from her initial mastectomy, particular if she needs adjuvant radiation and chemotherapy.  We will presume that she will be ready for risk reducing robotic assisted total hysterectomy with BSO on February 22, 2021.  We will bring the patient back close to the surgical date for an appointment with Joylene John NP for preoperative counseling.  I have reviewed the plan for surgery, the outpatient surgical stay, and anticipated convalescence.  I expect she will require 2 to 4 weeks time off of work depending on her heavy lifting responsibilities at work.  She reported abnormal uterine bleeding which have been treated with birth control pills successfully in the past but these were discontinued with her breast cancer diagnosis.  I explained to the  patient that I did not have a nonhormonal remedy for her irregular bleeding that I could substitute, and we may have to palliate symptoms until she can have a definitive surgical procedure.  She has a history of abnormal Pap smears and a procedure either biopsy or LEEP at [facility] in 2017.  We repeated Pap cytology and HPV testing today.  I explained to the patient that she continues to have an increased risk for vaginal cancer even after hysterectomy with removal of the cervix.  Therefore she will require surveillance Paps with her gynecologist or PCP for at least 20 years after completion hysterectomy.   HPI: Ms Pamela [name] is a 45 year old P3 who was seen in consultation at the request of Dr Lindi Adie for evaluation of a deleterious mutation in [place].  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  It was ER and PR positive HER-2 negative.  She has a strong family history for breast cancer including her sister who is BRCA1 positive is a possibility that her maternal grandmother had ovarian cancer that this is unclear.  Otherwise there is no apparent ovarian cancer in her family tree.  She had planned treatment for her breast cancer diagnosis with mastectomy and expander placement followed by delayed placement of implants in potentially May 2022 pending pathology results from her mastectomy. Her medical history is most significant for depression, poor appetite.  Her surgical history is most significant for tubal ligation.  Her gynecologic history is remarkable  for an abnormal Pap smear in 2017 for which she underwent a colposcopic procedure at [facility], which sounds like cervical biopsy.  Is unclear if she had required excisional procedure following this.  She reported repeat Pap that was normal in the intervening time.Marland Kitchen  Her family cancer history is significant for  multiple family members on the maternal side with breast cancer at young age.  She works as a Marine scientist in [employer].   Current Meds:  Outpatient Encounter Medications as of 10/06/2020  Medication Sig  . cyanocobalamin 1000 MCG tablet Take by mouth.  Marland Kitchen FLUoxetine (PROZAC) 40 MG capsule Take 40 mg by mouth at bedtime.  . nitrofurantoin, macrocrystal-monohydrate, (MACROBID) 100 MG capsule Use 1 tab po x 1 after intercourse  . [DISCONTINUED] methocarbamol (ROBAXIN) 500 MG tablet Take 500 mg by mouth 4 (four) times daily.  . [DISCONTINUED] mirtazapine (REMERON) 7.5 MG tablet mirtazapine 7.5 mg tablet   No facility-administered encounter medications on file as of 10/06/2020.    Allergy:  Allergies  Allergen Reactions  . Metronidazole Swelling and Other (See Comments)    Itching around lips and mouth Swollen Lips   . Sulfamethoxazole-Trimethoprim Diarrhea and Nausea Only  . Baclofen Nausea Only  . Chlorhexidine Rash  . Wound Dressing Adhesive Rash    Dermabond    Social Hx:   Social History   Socioeconomic History  . Marital status: Married    Spouse name: Lilinoe Acklin  . Number of children: 3  . Years of education: Not on file  . Highest education level: Not on file  Occupational History  . 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[employer]  Tobacco Use  . Smoking status: Former Smoker    Packs/day: 0.12    Years: 5.00    Pack years: 0.60    Types: Cigarettes  . Smokeless tobacco: Never Used  . Tobacco comment: quit 15 years ago  Vaping Use  . Vaping Use: Never used  Substance and Sexual Activity  . Alcohol use: Yes    Alcohol/week: 2.0 standard drinks    Types: 2 Glasses of wine per week    Comment: occ  . Drug use: Never  . Sexual activity: Yes    Birth control/protection: Surgical  Other Topics Concern  . Not on file  Social History Narrative  . Not on file   Social Determinants of Health   Financial Resource Strain: Not on file  Food Insecurity: Not on file   Transportation Needs: Not on file  Physical Activity: Not on file  Stress: Not on file  Social Connections: Not on file  Intimate Partner Violence: Not on file    Past Surgical Hx:  Past Surgical History:  Procedure Laterality Date  . CERVICAL DISC SURGERY     ruptured disc neck with prosthetic replacement  . GANGLION CYST EXCISION Left    left wrist  . TUBAL LIGATION      Past Medical Hx:  Past Medical History:  Diagnosis Date  . 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 Family history of BRCA1 gene positive   . Family history of breast cancer   . Family history of colon cancer   . Family history of prostate cancer     Past Gynecological History:  See HPI, hx of abnormal paps, hx of SVD x 3. Most recent abnormal pap in 2017. Irregular bleeding treated with OCPs (discontinued in January, 2022 with development of breast cancer). Patient's last menstrual period was 09/09/2020 (approximate).  Family Hx:  Family History  Problem Relation Age of Onset  . Breast cancer Mother        dx. in her 25s  . Breast cancer Sister 73       BRCA1 positive: [id number] ([place])  . Cancer Maternal Aunt        unconfirmed, patient suspects that she died from cancer younger than 26  . Breast cancer Maternal Grandmother        bilateral  . Cancer Maternal Grandmother        gynecologic  . Prostate cancer Maternal Uncle        dx. in his 40s  . Colon cancer Paternal Aunt        died in her early 55s  . Breast cancer Cousin 63       maternal first cousin  . Breast cancer Cousin        dx. in her 82s, paternal first cousin    Review of Systems:  Constitutional  Feels well,    ENT Normal appearing ears and nares bilaterally Skin/Breast  No rash, sores, jaundice, itching, dryness Cardiovascular  No chest pain, shortness of breath, or edema  Pulmonary  No cough or wheeze.  Gastro Intestinal  No nausea, vomitting, or diarrhoea. No bright red blood per rectum, no abdominal  pain, change in bowel movement, or constipation.  Genito Urinary  No frequency, urgency, dysuria, + irregular bleeding and spotting. Musculo Skeletal  No myalgia, arthralgia, joint swelling or pain  Neurologic  No weakness, numbness, change in gait,  Psychology  No depression, anxiety, insomnia.   Vitals:  Blood pressure 111/86, pulse 69, temperature (!) 97.3 F (36.3 C), temperature source Tympanic, resp. rate 18, height 5' 7"  (1.702 m), weight 150 lb 6.4 oz (68.2 kg), last menstrual period 09/09/2020, SpO2 100 %.  Physical Exam: WD in NAD Neck  Supple NROM, without any enlargements.  Lymph Node Survey No cervical supraclavicular or inguinal adenopathy Cardiovascular  Well perfused peripheries Lungs  No increased WOB Skin  No rash/lesions/breakdown  Psychiatry  Alert and oriented to person, place, and time  Abdomen  Normoactive bowel sounds, abdomen soft, non-tender and thin without evidence of hernia.  Back No CVA tenderness Genito Urinary  Vulva/vagina: Normal external female genitalia.   No lesions. No discharge or bleeding.  Bladder/urethra:  No lesions or masses, well supported bladder  Vagina: norma  Cervix: Normal appearing, no lesions. Pap taken  Uterus:  Slightly bulky, mobile, no parametrial involvement or nodularity.  Adnexa: no palpable masses. Rectal  deferred Extremities  No bilateral cyanosis, clubbing or edema.  60 minutes of total time was spent for this patient encounter, including preparation, face-to-face counseling with the patient and coordination of care, review of imaging (results and images), communication with the referring provider and documentation of the encounter.   Thereasa Solo, MD  10/06/2020, 10:31 AM

## 2020-10-08 ENCOUNTER — Other Ambulatory Visit (HOSPITAL_COMMUNITY): Payer: BC Managed Care – PPO

## 2020-10-10 ENCOUNTER — Ambulatory Visit (HOSPITAL_COMMUNITY)
Admission: RE | Admit: 2020-10-10 | Discharge: 2020-10-10 | Disposition: A | Discharge status: Home / Self Care | Payer: BC Managed Care – PPO | Source: Ambulatory Visit | Attending: Gynecologic Oncology | Admitting: Gynecologic Oncology

## 2020-10-10 ENCOUNTER — Other Ambulatory Visit (HOSPITAL_COMMUNITY)
Admission: RE | Admit: 2020-10-10 | Discharge: 2020-10-10 | Disposition: A | Discharge status: Home / Self Care | Payer: BC Managed Care – PPO | Source: Ambulatory Visit | Attending: General Surgery | Admitting: General Surgery

## 2020-10-10 ENCOUNTER — Other Ambulatory Visit: Payer: Self-pay

## 2020-10-10 DIAGNOSIS — Z1501 Genetic susceptibility to malignant neoplasm of breast: Secondary | ICD-10-CM

## 2020-10-10 DIAGNOSIS — N939 Abnormal uterine and vaginal bleeding, unspecified: Secondary | ICD-10-CM

## 2020-10-10 DIAGNOSIS — Z20822 Contact with and (suspected) exposure to covid-19: Secondary | ICD-10-CM | POA: Insufficient documentation

## 2020-10-10 DIAGNOSIS — Z1509 Genetic susceptibility to other malignant neoplasm: Secondary | ICD-10-CM | POA: Diagnosis not present

## 2020-10-10 DIAGNOSIS — Z1502 Genetic susceptibility to malignant neoplasm of ovary: Secondary | ICD-10-CM | POA: Insufficient documentation

## 2020-10-10 DIAGNOSIS — Z01812 Encounter for preprocedural laboratory examination: Secondary | ICD-10-CM | POA: Diagnosis present

## 2020-10-10 LAB — CYTOLOGY - PAP
Comment: NEGATIVE
Diagnosis: NEGATIVE
High risk HPV: NEGATIVE

## 2020-10-10 LAB — SARS CORONAVIRUS 2 (TAT 6-24 HRS): SARS Coronavirus 2: NEGATIVE

## 2020-10-11 ENCOUNTER — Telehealth: Payer: Self-pay

## 2020-10-11 NOTE — Telephone Encounter (Addendum)
Told Ms Shedden that her pap smear was normal and HPV high risk negative per Melissa Cross,NP The [place] was normal.  The right ovary was not visualized due to the bowel blocking it but no masses seen.  Continue with plan discussed at visit with Dr. Denman [place] on 10-06-20 per Melissa Cross,NP. Gave Ms Warbington her pre op appointment at [facility] for 02-17-21 at 1030.  She is to register in admitting at 1015. Ms Crosland verbalized understanding.

## 2020-10-12 ENCOUNTER — Encounter (HOSPITAL_BASED_OUTPATIENT_CLINIC_OR_DEPARTMENT_OTHER)
Admission: RE | Disposition: A | Discharge status: Home / Self Care | Payer: Self-pay | Source: Home / Self Care | Attending: General Surgery

## 2020-10-12 ENCOUNTER — Ambulatory Visit (HOSPITAL_BASED_OUTPATIENT_CLINIC_OR_DEPARTMENT_OTHER): Payer: BC Managed Care – PPO | Admitting: Anesthesiology

## 2020-10-12 ENCOUNTER — Other Ambulatory Visit: Payer: Self-pay

## 2020-10-12 ENCOUNTER — Observation Stay (HOSPITAL_BASED_OUTPATIENT_CLINIC_OR_DEPARTMENT_OTHER)
Admission: RE | Admit: 2020-10-12 | Discharge: 2020-10-13 | Disposition: A | Discharge status: Home / Self Care | Payer: BC Managed Care – PPO | Attending: General Surgery | Admitting: General Surgery

## 2020-10-12 ENCOUNTER — Encounter (HOSPITAL_BASED_OUTPATIENT_CLINIC_OR_DEPARTMENT_OTHER): Payer: Self-pay | Admitting: General Surgery

## 2020-10-12 ENCOUNTER — Encounter (HOSPITAL_COMMUNITY)
Admission: RE | Admit: 2020-10-12 | Discharge: 2020-10-12 | Disposition: A | Discharge status: Home / Self Care | Payer: BC Managed Care – PPO | Source: Ambulatory Visit | Attending: General Surgery | Admitting: General Surgery

## 2020-10-12 DIAGNOSIS — Z170 Estrogen receptor positive status [ER+]: Secondary | ICD-10-CM

## 2020-10-12 DIAGNOSIS — C50911 Malignant neoplasm of unspecified site of right female breast: Secondary | ICD-10-CM | POA: Diagnosis present

## 2020-10-12 DIAGNOSIS — Z79899 Other long term (current) drug therapy: Secondary | ICD-10-CM | POA: Insufficient documentation

## 2020-10-12 DIAGNOSIS — Z1501 Genetic susceptibility to malignant neoplasm of breast: Secondary | ICD-10-CM | POA: Diagnosis not present

## 2020-10-12 DIAGNOSIS — Z87891 Personal history of nicotine dependence: Secondary | ICD-10-CM | POA: Insufficient documentation

## 2020-10-12 DIAGNOSIS — C50411 Malignant neoplasm of upper-outer quadrant of right female breast: Principal | ICD-10-CM | POA: Insufficient documentation

## 2020-10-12 HISTORY — PX: NIPPLE SPARING MASTECTOMY WITH SENTINEL LYMPH NODE BIOPSY: SHX6826

## 2020-10-12 HISTORY — PX: BREAST RECONSTRUCTION WITH PLACEMENT OF TISSUE EXPANDER AND ALLODERM: SHX6805

## 2020-10-12 LAB — POCT PREGNANCY, URINE: Preg Test, Ur: NEGATIVE

## 2020-10-12 SURGERY — NIPPLE SPARING MASTECTOMY WITH SENTINEL LYMPH NODE BIOPSY
Anesthesia: General | Site: Breast | Laterality: Bilateral

## 2020-10-12 MED ORDER — ONDANSETRON HCL 4 MG/2ML IJ SOLN
INTRAMUSCULAR | Status: AC
  Filled 2020-10-12: qty 2

## 2020-10-12 MED ORDER — PHENYLEPHRINE 40 MCG/ML (10ML) SYRINGE FOR IV PUSH (FOR BLOOD PRESSURE SUPPORT)
PREFILLED_SYRINGE | INTRAVENOUS | Status: AC
  Filled 2020-10-12: qty 10

## 2020-10-12 MED ORDER — FENTANYL CITRATE (PF) 100 MCG/2ML IJ SOLN
INTRAMUSCULAR | Status: DC | PRN
  Administered 2020-10-12: 50 ug via INTRAVENOUS

## 2020-10-12 MED ORDER — DEXAMETHASONE SODIUM PHOSPHATE 10 MG/ML IJ SOLN
INTRAMUSCULAR | Status: AC
  Filled 2020-10-12: qty 1

## 2020-10-12 MED ORDER — FENTANYL CITRATE (PF) 100 MCG/2ML IJ SOLN
INTRAMUSCULAR | Status: AC
  Filled 2020-10-12: qty 2

## 2020-10-12 MED ORDER — SUGAMMADEX SODIUM 200 MG/2ML IV SOLN
INTRAVENOUS | Status: DC | PRN
  Administered 2020-10-12: 200 mg via INTRAVENOUS

## 2020-10-12 MED ORDER — ONDANSETRON HCL 4 MG/2ML IJ SOLN: 4.0000 mg | Freq: Four times a day (QID) | INTRAMUSCULAR | Status: DC | PRN

## 2020-10-12 MED ORDER — OXYCODONE HCL 5 MG/5ML PO SOLN
5.0000 mg | Freq: Once | ORAL | Status: DC | PRN
Start: 2020-10-12 — End: 2020-10-13

## 2020-10-12 MED ORDER — EPHEDRINE 5 MG/ML INJ
INTRAVENOUS | Status: AC
  Filled 2020-10-12: qty 10

## 2020-10-12 MED ORDER — ENSURE PRE-SURGERY PO LIQD: 296.0000 mL | Freq: Once | ORAL | Status: DC

## 2020-10-12 MED ORDER — 0.9 % SODIUM CHLORIDE (POUR BTL) OPTIME
TOPICAL | Status: DC | PRN
  Administered 2020-10-12: 1000 mL

## 2020-10-12 MED ORDER — FENTANYL CITRATE (PF) 100 MCG/2ML IJ SOLN
100.0000 ug | Freq: Once | INTRAMUSCULAR | Status: AC
  Administered 2020-10-12: 50 ug via INTRAVENOUS

## 2020-10-12 MED ORDER — CEFAZOLIN SODIUM-DEXTROSE 2-4 GM/100ML-% IV SOLN
INTRAVENOUS | Status: AC
  Filled 2020-10-12: qty 100

## 2020-10-12 MED ORDER — ACETAMINOPHEN 500 MG PO TABS
1000.0000 mg | ORAL_TABLET | ORAL | Status: AC
  Administered 2020-10-12: 1000 mg via ORAL

## 2020-10-12 MED ORDER — FLUOXETINE HCL 20 MG PO CAPS
40.0000 mg | ORAL_CAPSULE | Freq: Every day | ORAL | Status: DC
Start: 2020-10-12 — End: 2020-10-13
  Filled 2020-10-12: qty 2

## 2020-10-12 MED ORDER — TECHNETIUM TC 99M TILMANOCEPT KIT
1.0000 | PACK | Freq: Once | INTRAVENOUS | Status: AC | PRN
  Administered 2020-10-12: 1 via INTRADERMAL

## 2020-10-12 MED ORDER — DOXYCYCLINE HYCLATE 100 MG PO CAPS
100.0000 mg | ORAL_CAPSULE | Freq: Two times a day (BID) | ORAL | 0 refills | Status: DC
Start: 2020-10-12 — End: 2021-02-16

## 2020-10-12 MED ORDER — SIMETHICONE 80 MG PO CHEW: 40.0000 mg | CHEWABLE_TABLET | Freq: Four times a day (QID) | ORAL | Status: DC | PRN

## 2020-10-12 MED ORDER — ROCURONIUM BROMIDE 10 MG/ML (PF) SYRINGE
PREFILLED_SYRINGE | INTRAVENOUS | Status: AC
  Filled 2020-10-12: qty 10

## 2020-10-12 MED ORDER — PROPOFOL 10 MG/ML IV BOLUS
INTRAVENOUS | Status: DC | PRN
  Administered 2020-10-12: 150 mg via INTRAVENOUS

## 2020-10-12 MED ORDER — KETOROLAC TROMETHAMINE 15 MG/ML IJ SOLN
15.0000 mg | INTRAMUSCULAR | Status: AC
  Administered 2020-10-12: 15 mg via INTRAVENOUS

## 2020-10-12 MED ORDER — KETOROLAC TROMETHAMINE 15 MG/ML IJ SOLN
15.0000 mg | Freq: Four times a day (QID) | INTRAMUSCULAR | Status: DC | PRN
  Administered 2020-10-12: 15 mg via INTRAVENOUS
  Filled 2020-10-12: qty 1

## 2020-10-12 MED ORDER — SCOPOLAMINE 1 MG/3DAYS TD PT72
MEDICATED_PATCH | TRANSDERMAL | Status: AC
  Filled 2020-10-12: qty 1

## 2020-10-12 MED ORDER — AMISULPRIDE (ANTIEMETIC) 5 MG/2ML IV SOLN
10.0000 mg | Freq: Once | INTRAVENOUS | Status: AC
  Administered 2020-10-12: 10 mg via INTRAVENOUS

## 2020-10-12 MED ORDER — KETOROLAC TROMETHAMINE 15 MG/ML IJ SOLN
INTRAMUSCULAR | Status: AC
  Filled 2020-10-12: qty 1

## 2020-10-12 MED ORDER — SUCCINYLCHOLINE CHLORIDE 200 MG/10ML IV SOSY
PREFILLED_SYRINGE | INTRAVENOUS | Status: AC
  Filled 2020-10-12: qty 10

## 2020-10-12 MED ORDER — SODIUM CHLORIDE 0.9 % IV SOLN
INTRAVENOUS | Status: DC | PRN
  Administered 2020-10-12: 500 mL

## 2020-10-12 MED ORDER — PROPOFOL 500 MG/50ML IV EMUL
INTRAVENOUS | Status: AC
  Filled 2020-10-12: qty 50

## 2020-10-12 MED ORDER — MIDAZOLAM HCL 2 MG/2ML IJ SOLN
INTRAMUSCULAR | Status: AC
  Filled 2020-10-12: qty 2

## 2020-10-12 MED ORDER — PROPOFOL 500 MG/50ML IV EMUL
INTRAVENOUS | Status: DC | PRN
  Administered 2020-10-12: 25 ug/kg/min via INTRAVENOUS

## 2020-10-12 MED ORDER — OXYCODONE HCL 5 MG PO TABS
5.0000 mg | ORAL_TABLET | ORAL | Status: DC | PRN
  Administered 2020-10-12 – 2020-10-13 (×3): 5 mg via ORAL
  Administered 2020-10-13: 10 mg via ORAL
  Filled 2020-10-12: qty 1
  Filled 2020-10-12: qty 2
  Filled 2020-10-12: qty 1
  Filled 2020-10-12: qty 2

## 2020-10-12 MED ORDER — SODIUM CHLORIDE 0.9 % IV SOLN: INTRAVENOUS | Status: DC

## 2020-10-12 MED ORDER — ROCURONIUM BROMIDE 100 MG/10ML IV SOLN
INTRAVENOUS | Status: DC | PRN
  Administered 2020-10-12 (×2): 20 mg via INTRAVENOUS
  Administered 2020-10-12: 50 mg via INTRAVENOUS

## 2020-10-12 MED ORDER — DIPHENHYDRAMINE HCL 50 MG/ML IJ SOLN
INTRAMUSCULAR | Status: DC | PRN
  Administered 2020-10-12: 12.5 mg via INTRAVENOUS

## 2020-10-12 MED ORDER — OXYCODONE HCL 5 MG PO TABS: 5.0000 mg | ORAL_TABLET | ORAL | 0 refills | Status: DC | PRN

## 2020-10-12 MED ORDER — ACETAMINOPHEN 500 MG PO TABS
ORAL_TABLET | ORAL | Status: AC
  Filled 2020-10-12: qty 2

## 2020-10-12 MED ORDER — LIDOCAINE 2% (20 MG/ML) 5 ML SYRINGE
INTRAMUSCULAR | Status: AC
  Filled 2020-10-12: qty 5

## 2020-10-12 MED ORDER — ACETAMINOPHEN 500 MG PO TABS
1000.0000 mg | ORAL_TABLET | Freq: Four times a day (QID) | ORAL | Status: DC
  Administered 2020-10-12 – 2020-10-13 (×2): 1000 mg via ORAL
  Filled 2020-10-12 (×2): qty 2

## 2020-10-12 MED ORDER — CEFAZOLIN SODIUM-DEXTROSE 2-4 GM/100ML-% IV SOLN
2.0000 g | INTRAVENOUS | Status: AC
  Administered 2020-10-12: 2 g via INTRAVENOUS

## 2020-10-12 MED ORDER — AMISULPRIDE (ANTIEMETIC) 5 MG/2ML IV SOLN
INTRAVENOUS | Status: AC
  Filled 2020-10-12: qty 4

## 2020-10-12 MED ORDER — MIDAZOLAM HCL 2 MG/2ML IJ SOLN
2.0000 mg | Freq: Once | INTRAMUSCULAR | Status: AC
  Administered 2020-10-12: 2 mg via INTRAVENOUS

## 2020-10-12 MED ORDER — OXYCODONE HCL 5 MG PO TABS: 5.0000 mg | ORAL_TABLET | Freq: Once | ORAL | Status: DC | PRN

## 2020-10-12 MED ORDER — DIPHENHYDRAMINE HCL 50 MG/ML IJ SOLN
INTRAMUSCULAR | Status: AC
  Filled 2020-10-12: qty 1

## 2020-10-12 MED ORDER — BUPIVACAINE-EPINEPHRINE (PF) 0.5% -1:200000 IJ SOLN
INTRAMUSCULAR | Status: DC | PRN
  Administered 2020-10-12 (×2): 15 mL via PERINEURAL

## 2020-10-12 MED ORDER — METHOCARBAMOL 500 MG PO TABS
500.0000 mg | ORAL_TABLET | Freq: Four times a day (QID) | ORAL | Status: DC | PRN
  Administered 2020-10-12: 500 mg via ORAL
  Filled 2020-10-12: qty 1

## 2020-10-12 MED ORDER — ONDANSETRON HCL 4 MG/2ML IJ SOLN
INTRAMUSCULAR | Status: DC | PRN
  Administered 2020-10-12: 4 mg via INTRAVENOUS

## 2020-10-12 MED ORDER — POVIDONE-IODINE 10 % EX SOLN
CUTANEOUS | Status: DC | PRN
  Administered 2020-10-12: 1 via TOPICAL

## 2020-10-12 MED ORDER — MORPHINE SULFATE (PF) 4 MG/ML IV SOLN: 1.0000 mg | INTRAVENOUS | Status: DC | PRN

## 2020-10-12 MED ORDER — DEXAMETHASONE SODIUM PHOSPHATE 4 MG/ML IJ SOLN
INTRAMUSCULAR | Status: DC | PRN
  Administered 2020-10-12: 10 mg via INTRAVENOUS

## 2020-10-12 MED ORDER — MIDAZOLAM HCL 5 MG/5ML IJ SOLN
INTRAMUSCULAR | Status: DC | PRN
  Administered 2020-10-12: 1 mg via INTRAVENOUS

## 2020-10-12 MED ORDER — CYCLOBENZAPRINE HCL 10 MG PO TABS: 10.0000 mg | ORAL_TABLET | Freq: Three times a day (TID) | ORAL | 0 refills | Status: DC | PRN

## 2020-10-12 MED ORDER — SODIUM CHLORIDE 0.9 % IV SOLN
INTRAVENOUS | Status: AC
  Filled 2020-10-12: qty 10

## 2020-10-12 MED ORDER — LACTATED RINGERS IV SOLN: INTRAVENOUS | Status: DC

## 2020-10-12 MED ORDER — ONDANSETRON 4 MG PO TBDP: 4.0000 mg | ORAL_TABLET | Freq: Four times a day (QID) | ORAL | Status: DC | PRN

## 2020-10-12 MED ORDER — EPHEDRINE SULFATE 50 MG/ML IJ SOLN
INTRAMUSCULAR | Status: DC | PRN
  Administered 2020-10-12 (×2): 10 mg via INTRAVENOUS

## 2020-10-12 MED ORDER — CEFAZOLIN SODIUM-DEXTROSE 2-4 GM/100ML-% IV SOLN
2.0000 g | Freq: Three times a day (TID) | INTRAVENOUS | Status: AC
  Administered 2020-10-12 – 2020-10-13 (×2): 2 g via INTRAVENOUS
  Filled 2020-10-12 (×2): qty 100

## 2020-10-12 MED ORDER — LIDOCAINE HCL (CARDIAC) PF 100 MG/5ML IV SOSY
PREFILLED_SYRINGE | INTRAVENOUS | Status: DC | PRN
  Administered 2020-10-12: 60 mg via INTRAVENOUS

## 2020-10-12 MED ORDER — FENTANYL CITRATE (PF) 100 MCG/2ML IJ SOLN
25.0000 ug | INTRAMUSCULAR | Status: DC | PRN
Start: 2020-10-12 — End: 2020-10-13
  Administered 2020-10-12: 50 ug via INTRAVENOUS

## 2020-10-12 SURGICAL SUPPLY — 100 items
ADH SKN CLS APL DERMABOND .7 (GAUZE/BANDAGES/DRESSINGS)
ALLOGRAFT PERF 16X20 1.6+/-0.4 (Tissue) ×2 IMPLANT
APL SKNCLS STERI-STRIP NONHPOA (GAUZE/BANDAGES/DRESSINGS) ×1
APPLIER CLIP 11 MED OPEN (CLIP) ×2
APPLIER CLIP 9.375 MED OPEN (MISCELLANEOUS)
APR CLP MED 11 20 MLT OPN (CLIP) ×1
APR CLP MED 9.3 20 MLT OPN (MISCELLANEOUS)
BAG DECANTER FOR FLEXI CONT (MISCELLANEOUS) ×2 IMPLANT
BENZOIN TINCTURE PRP APPL 2/3 (GAUZE/BANDAGES/DRESSINGS) ×1 IMPLANT
BINDER BREAST LRG (GAUZE/BANDAGES/DRESSINGS) IMPLANT
BINDER BREAST MEDIUM (GAUZE/BANDAGES/DRESSINGS) IMPLANT
BINDER BREAST XLRG (GAUZE/BANDAGES/DRESSINGS) ×1 IMPLANT
BINDER BREAST XXLRG (GAUZE/BANDAGES/DRESSINGS) IMPLANT
BIOPATCH RED 1 DISK 7.0 (GAUZE/BANDAGES/DRESSINGS) IMPLANT
BLADE CLIPPER SURG (BLADE) IMPLANT
BLADE SURG 10 STRL SS (BLADE) ×3 IMPLANT
BLADE SURG 15 STRL LF DISP TIS (BLADE) ×1 IMPLANT
BLADE SURG 15 STRL SS (BLADE) ×2
BNDG GAUZE ELAST 4 BULKY (GAUZE/BANDAGES/DRESSINGS) ×2 IMPLANT
CANISTER SUCT 1200ML W/VALVE (MISCELLANEOUS) ×3 IMPLANT
CLIP APPLIE 11 MED OPEN (CLIP) ×1 IMPLANT
CLIP APPLIE 9.375 MED OPEN (MISCELLANEOUS) IMPLANT
CLIP VESOCCLUDE SM WIDE 6/CT (CLIP) IMPLANT
COUNTER NEEDLE 1200 MAGNETIC (NEEDLE) IMPLANT
COVER BACK TABLE 60X90IN (DRAPES) ×2 IMPLANT
COVER MAYO STAND STRL (DRAPES) ×4 IMPLANT
COVER PROBE W GEL 5X96 (DRAPES) ×1 IMPLANT
COVER WAND RF STERILE (DRAPES) IMPLANT
DECANTER SPIKE VIAL GLASS SM (MISCELLANEOUS) IMPLANT
DERMABOND ADVANCED (GAUZE/BANDAGES/DRESSINGS)
DERMABOND ADVANCED .7 DNX12 (GAUZE/BANDAGES/DRESSINGS) ×1 IMPLANT
DRAIN CHANNEL 15F RND FF W/TCR (WOUND CARE) ×2 IMPLANT
DRAIN CHANNEL 19F RND (DRAIN) ×2 IMPLANT
DRAPE GAMMA PROBE CRDLSS 10X38 (DRAPES) ×1 IMPLANT
DRAPE INCISE IOBAN 66X45 STRL (DRAPES) ×2 IMPLANT
DRAPE TOP ARMCOVERS (MISCELLANEOUS) ×2 IMPLANT
DRAPE U-SHAPE 76X120 STRL (DRAPES) ×3 IMPLANT
DRAPE UTILITY XL STRL (DRAPES) ×3 IMPLANT
DRSG PAD ABDOMINAL 8X10 ST (GAUZE/BANDAGES/DRESSINGS) ×4 IMPLANT
DRSG TEGADERM 4X10 (GAUZE/BANDAGES/DRESSINGS) ×5 IMPLANT
DRSG TEGADERM 4X4.75 (GAUZE/BANDAGES/DRESSINGS) IMPLANT
ELECT BLADE 4.0 EZ CLEAN MEGAD (MISCELLANEOUS) ×2
ELECT COATED BLADE 2.86 ST (ELECTRODE) ×2 IMPLANT
ELECT REM PT RETURN 9FT ADLT (ELECTROSURGICAL) ×2
ELECTRODE BLDE 4.0 EZ CLN MEGD (MISCELLANEOUS) ×1 IMPLANT
ELECTRODE REM PT RTRN 9FT ADLT (ELECTROSURGICAL) ×1 IMPLANT
EVACUATOR SILICONE 100CC (DRAIN) ×4 IMPLANT
EXPANDER TISSUE FV FOURTE 400 (Prosthesis & Implant Plastic) IMPLANT
GAUZE SPONGE 4X4 12PLY STRL LF (GAUZE/BANDAGES/DRESSINGS) IMPLANT
GLOVE SURG ENC MOIS LTX SZ6 (GLOVE) ×4 IMPLANT
GLOVE SURG ENC MOIS LTX SZ7 (GLOVE) ×2 IMPLANT
GLOVE SURG UNDER POLY LF SZ7.5 (GLOVE) ×2 IMPLANT
GOWN STRL REUS W/ TWL LRG LVL3 (GOWN DISPOSABLE) ×4 IMPLANT
GOWN STRL REUS W/TWL LRG LVL3 (GOWN DISPOSABLE) ×8
HEMOSTAT ARISTA ABSORB 3G PWDR (HEMOSTASIS) IMPLANT
KIT FILL ASEPTIC TRANSFER (MISCELLANEOUS) ×1 IMPLANT
KIT FILL SYSTEM UNIVERSAL (SET/KITS/TRAYS/PACK) IMPLANT
LIGHT WAVEGUIDE WIDE FLAT (MISCELLANEOUS) ×2 IMPLANT
MARKER SKIN DUAL TIP RULER LAB (MISCELLANEOUS) IMPLANT
NDL HYPO 25X1 1.5 SAFETY (NEEDLE) IMPLANT
NDL SAFETY ECLIPSE 18X1.5 (NEEDLE) IMPLANT
NEEDLE HYPO 18GX1.5 SHARP (NEEDLE)
NEEDLE HYPO 25X1 1.5 SAFETY (NEEDLE) IMPLANT
NS IRRIG 1000ML POUR BTL (IV SOLUTION) ×3 IMPLANT
PACK BASIN DAY SURGERY FS (CUSTOM PROCEDURE TRAY) ×2 IMPLANT
PENCIL SMOKE EVACUATOR (MISCELLANEOUS) ×2 IMPLANT
PIN SAFETY STERILE (MISCELLANEOUS) ×2 IMPLANT
PUNCH BIOPSY DERMAL 4MM (MISCELLANEOUS) IMPLANT
RETRACTOR ONETRAX LX 90X20 (MISCELLANEOUS) IMPLANT
SHEET MEDIUM DRAPE 40X70 STRL (DRAPES) ×3 IMPLANT
SLEEVE SCD COMPRESS KNEE MED (MISCELLANEOUS) ×2 IMPLANT
SPONGE LAP 18X18 RF (DISPOSABLE) ×6 IMPLANT
SPONGE LAP 4X18 RFD (DISPOSABLE) IMPLANT
STAPLER VISISTAT 35W (STAPLE) ×2 IMPLANT
STRIP CLOSURE SKIN 1/2X4 (GAUZE/BANDAGES/DRESSINGS) ×1 IMPLANT
SUT CHROMIC 4 0 PS 2 18 (SUTURE) ×5 IMPLANT
SUT ETHILON 2 0 FS 18 (SUTURE) ×3 IMPLANT
SUT MNCRL AB 4-0 PS2 18 (SUTURE) ×4 IMPLANT
SUT SILK 2 0 SH (SUTURE) ×2 IMPLANT
SUT VIC AB 2-0 SH 27 (SUTURE) ×2
SUT VIC AB 2-0 SH 27XBRD (SUTURE) ×2 IMPLANT
SUT VIC AB 3-0 54X BRD REEL (SUTURE) IMPLANT
SUT VIC AB 3-0 BRD 54 (SUTURE)
SUT VIC AB 3-0 SH 27 (SUTURE) ×4
SUT VIC AB 3-0 SH 27X BRD (SUTURE) IMPLANT
SUT VICRYL 0 CT-2 (SUTURE) ×7 IMPLANT
SUT VICRYL 3-0 CR8 SH (SUTURE) ×3 IMPLANT
SUT VICRYL 4-0 PS2 18IN ABS (SUTURE) ×2 IMPLANT
SUT VLOC 180 0 24IN GS25 (SUTURE) ×2 IMPLANT
SYR 50ML LL SCALE MARK (SYRINGE) ×2 IMPLANT
SYR BULB IRRIG 60ML STRL (SYRINGE) ×3 IMPLANT
SYR CONTROL 10ML LL (SYRINGE) IMPLANT
TAPE MEASURE VINYL STERILE (MISCELLANEOUS) IMPLANT
TISSUE EXPNDR FV FOURTE 400 (Prosthesis & Implant Plastic) ×4 IMPLANT
TOWEL GREEN STERILE FF (TOWEL DISPOSABLE) ×4 IMPLANT
TRAY DSU PREP LF (CUSTOM PROCEDURE TRAY) ×2 IMPLANT
TRAY FOLEY W/BAG SLVR 14FR LF (SET/KITS/TRAYS/PACK) IMPLANT
TUBE CONNECTING 20X1/4 (TUBING) ×3 IMPLANT
UNDERPAD 30X36 HEAVY ABSORB (UNDERPADS AND DIAPERS) ×4 IMPLANT
YANKAUER SUCT BULB TIP NO VENT (SUCTIONS) ×2 IMPLANT

## 2020-10-12 NOTE — Op Note (Signed)
Operative Note   DATE OF OPERATION: 2.16.2022  LOCATION: [facility]-observation  SURGICAL DIVISION: Plastic Surgery  PREOPERATIVE DIAGNOSES:  1. Right breast cancer UOQ ER+ 2. BRCA1  POSTOPERATIVE DIAGNOSES:  same  PROCEDURE:  1. Bilateral breast reconstruction with tissue expanders 2. Acellular dermis to chest 600cm2  SURGEON: Irene Limbo MD MBA  ASSISTANT: none  ANESTHESIA:  General.   EBL: 75 ml for entire procedure  COMPLICATIONS: None immediate.   INDICATIONS FOR PROCEDURE:  The patient, Pamela [name], is a 45 y.o. female born on Jan 30, 1976, is here for bilateral prepectoral expander based reconstruction following nipple sparing mastectomies.   FINDINGS: Natrelle 133S FV-12-T 400 ml tissue expanders placed bilateral initial fill volume 275 ml air bilateral. RIGHT SN [id number] LEFT [id number]  DESCRIPTION OF PROCEDURE:  The patient was marked with the patient in the preoperative area to mark sternal notch, chest midline, anterior axillary lines and inframammary folds.Following induction, Foley catheter placed. The patient's operative site was prepped and draped in a sterile fashion. A time out was performed and all information was confirmed to be correct.I assisted in mastectomieswith exposure and retraction.Following completion of mastectomies, reconstruction began onleftside.  The cavity was irrigated withsaline. Hemostasis ensured. Cavity irrigated with saline solution containing Ancef, gentamicin, and Betadine. A 19 Fr drain was placed in subcutaneous position laterally and a 15 Fr drain placed along inframammary fold. Each secured to skin with 2-0 nylon. The tissue expanders were prepared on back table prior in insertion. The expander was filled with air.Perforated acellular dermiswasdraped over anterior surface expander. The ADM was then secured to itself over posterior surface of expanderwith 4-0 chromic. Redundant folds acellular dermis excised so  that the ADM layflat without folds over air filled expander.The expander was secured tofascia over lateral sternal borderwith a 0 vicryl. Thelateral tab wasalso secured to pectoralis muscle with 0-vicryl. The ADM was secured to pectoralis muscle and chest wall along inferior border at inframammary foldwith 0 V-lock suture.Laterally the mastectomy flap over posterior axillary line was advanced anteriorly and the subcutaneous tissue and superficial fascia was secured to pectoralisand serratusmuscleswith 0-vicryl. Skin closure completedwith 3-0 vicryl in fascial layer and 4-0 vicryl in dermis. Skin closure completed with 4-0 monocryl subcuticular and tissue adhesive.  I then directed my attention torightchest where similar irrigation and drain placement completed. The prepared expander with ADM secured over anterior surface was placed inleftchest and tabs secured to chest wall and pectoralis muscle with 0- vicryl suture. The acellular dermis at inframammary fold was secured to chest wall with 0 V-lock suture.Laterally the mastectomy flap over posterior axillary line was advanced anteriorly and the subcutaneous tissue and superficial fascia was secured to pectoralisand serratusmuscleswith 0-vicryl. Skin closure completedwith 3-0 vicryl in fascial layer and 4-0 vicryl in dermis. Skin closure completed with 4-0 monocryl subcuticular. Steri strips applied to right axillary incision. The mastectomy flaps were redrapedso that NAC was symmetric from sternal notch and chest midline. Biopatches applied around each drain exit site. Tegaderm dressings applied followed bydry dressing,breast binder.  SPECIMENS: none  DRAINS: 15 and 19 Fr JP in right and left breast reconstruction

## 2020-10-12 NOTE — H&P (Signed)
Pamela [name] is an 45 y.o. female.   Chief Complaint: brca 1 mutation, right breast cancer HPI:  12 yof who has sister with breast cancer I have cared for. her sister was found to have brca 1 mutation and I tested the patient when her sister was at postop appt. she does have a BRCA1 mutation. she has a mm at [facility] in March that shows an incidental hypoechoic mass in the left breast. biopsy was done and shows benign breast tissue with FA changes and this was read as concordant. she also has a negative mri 3/21 at [facility]. I saw her before to discuss risk reducing mastectomies and she saw plastics with plan for February. In the interim she noted a low axillary mass. this was evaluated at [facility]. I have all these reports. she had repeat mm on 1/22 that shows on left side there was focal asymmetry in lateral breast that on [place] is 1.4 cm cyst. on the right there is a 3x5x7 mm mass in the low axilla. 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 she has no discharge. she is here with her sister to discuss options today  Past Medical History:  Diagnosis Date  . 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 Family history of BRCA1 gene positive   . Family history of breast cancer   . Family history of colon cancer   . Family history of prostate cancer     Past Surgical History:  Procedure Laterality Date  . CERVICAL DISC SURGERY     ruptured disc neck with prosthetic replacement  . GANGLION CYST EXCISION Left    left wrist  . TUBAL LIGATION      Family History  Problem Relation Age of Onset  . Breast cancer Mother        dx. in her 92s  . Breast cancer Sister 48       BRCA1 positive: [id number] (Intronic)  . Cancer Maternal Aunt        unconfirmed, patient suspects that she died from cancer younger than 8  . Breast cancer Maternal Grandmother        bilateral  . Cancer Maternal Grandmother        gynecologic  . Prostate cancer Maternal Uncle         dx. in his 58s  . Colon cancer Paternal Aunt        died in her early 105s  . Breast cancer Cousin 31       maternal first cousin  . Breast cancer Cousin        dx. in her 16s, paternal first cousin   Social History:  reports that she has quit smoking. Her smoking use included cigarettes. She has a 0.60 pack-year smoking history. She has never used smokeless tobacco. She reports current alcohol use of about 2.0 standard drinks of alcohol per week. She reports that she does not use drugs.  Allergies:  Allergies  Allergen Reactions  . Metronidazole Swelling and Other (See Comments)    Itching around lips and mouth Swollen Lips   . Sulfamethoxazole-Trimethoprim Diarrhea and Nausea Only  . Baclofen Nausea Only  . Chlorhexidine Rash  . Wound Dressing Adhesive Rash    Dermabond    Medications Prior to Admission  Medication Sig Dispense Refill  . cyanocobalamin 1000 MCG tablet Take by mouth.    Marland Kitchen FLUoxetine (PROZAC) 40 MG capsule Take 40 mg by  mouth at bedtime.    . nitrofurantoin, macrocrystal-monohydrate, (MACROBID) 100 MG capsule Use 1 tab po x 1 after intercourse      Results for orders placed or performed during the hospital encounter of 10/12/20 (from the past 48 hour(s))  Pregnancy, urine POC     Status: None   Collection Time: 10/12/20 10:38 AM  Result Value Ref Range   Preg Test, Ur NEGATIVE NEGATIVE    Comment:        THE SENSITIVITY OF THIS METHODOLOGY IS >24 mIU/mL    US PELVIC COMPLETE WITH TRANSVAGINAL  Result Date: 10/10/2020 CLINICAL DATA:  BRCA1 deleterious mutation, abnormal uterine bleeding; LMP 09/09/2020 EXAM: TRANSABDOMINAL AND TRANSVAGINAL ULTRASOUND OF PELVIS TECHNIQUE: Both transabdominal and transvaginal ultrasound examinations of the pelvis were performed. Transabdominal technique was performed for global imaging of the pelvis including uterus, ovaries, adnexal regions, and pelvic cul-de-sac. It was necessary to proceed with endovaginal exam following  the transabdominal exam to visualize the uterus, endometrium, and ovaries. COMPARISON:  None FINDINGS: Uterus Measurements: 8.5 x 4.2 x 5.1 cm = volume: 95 mL. Anteverted. Normal morphology without mass Endometrium Thickness: 5 mm.  No endometrial fluid or focal abnormality Right ovary Not visualized, likely obscured by bowel Left ovary Measurements: 3.6 x 2.2 x 2.7 cm = volume: 8.8 mL. Normal morphology without mass Other findings No free pelvic fluid.  No adnexal masses. IMPRESSION: Nonvisualization of RIGHT ovary. Otherwise normal exam. Electronically Signed   By: Lavonia Dana M.D.   On: 10/10/2020 16:12    Review of Systems  All other systems reviewed and are negative.   Blood pressure (!) 132/112, pulse 72, temperature 98.1 F (36.7 C), temperature source Oral, resp. rate 15, height 5' 7"  (1.702 m), weight 66.1 kg, last menstrual period 09/09/2020, SpO2 100 %. Physical Exam  General Mental Status-Alert. Orientation-Oriented X3. Breast Nipples-No Discharge. Note: no left breast mass right low axillary small superficial mass Lymphatic Head & Neck General Head & Neck Lymphatics: Bilateral - Description - Normal. Axillary General Axillary Region: Bilateral - Description - Normal. Note: no [place] adenopathy  Assessment/Plan BRCA1 POSITIVE (Z15.01) BREAST CANCER OF UPPER-OUTER QUADRANT OF RIGHT FEMALE BREAST (C50.411) Right nsm, right ax sn biopsy, left risk reducing nsm We discussed the staging and pathophysiology of breast cancer. We discussed all of the different options for treatment for breast cancer including surgery, chemotherapy, radiation therapy, Herceptin, and antiestrogen therapy. We discussed a sentinel lymph node biopsy as she does not appear to having lymph node involvement right now. We discussed the performance of that with injection of radioactive tracer. We discussed that there is a chance of having a positive node with a sentinel lymph node biopsy and we will await  the permanent pathology to make any other first further decisions in terms of her treatment. We discussed up to a 5% risk lifetime of chronic shoulder pain as well as lymphedema associated with a sentinel lymph node biopsy. I recommended (and we discussed before) bilateral nsm with reconstruction. she is still amenable to this. I think only difference now is I will do the sn biopsy via a low axillary incision and also excise the tumor and the overlying skin with this incision  Rolm Bookbinder, MD 10/12/2020, 11:42 AM

## 2020-10-12 NOTE — Op Note (Signed)
Preoperative diagnosis clinical stage II right breast cancer Postoperative diagnosis: Same as above Procedure: 1.  Left risk reducing nipple sparing mastectomy 2.  Right nipple sparing mastectomy 3.  Right deep axillary sentinel lymph node biopsy Surgeon: Dr. Serita Grammes Assistant: Dr. Irene Limbo Estimated blood loss: 161 cc Complications: None Drains: Per plastic surgery Anesthesia: General with a pectoral block Specimens: 1.  Left nipple sparing mastectomy marked short superior, long lateral, double nipple areola 2.  Left nipple biopsy 3.  Right nipple sparing mastectomy marked short superior, long lateral, double nipple areola 4.  Right nipple biopsy 5.  Right deep axillary sentinel lymph nodes with highest count of 589 6. Left axillary excision marked short superior, long lateral Special count was correct lesion Disposition to recovery stable condition  Indications: 26 yof who has sister with breast cancer I have cared for. her sister was found to have brca 1 mutation and I tested the patient when her sister was at postop appt. she does have a BRCA1 mutation. she has a mm at [facility] in March that shows an incidental hypoechoic mass in the left breast. biopsy was done and shows benign breast tissue with FA changes and this was read as concordant. she also has a negative mri 3/21 at [facility]. I saw her before to discuss risk reducing mastectomies and she saw plastics with plan for February. In the interim she noted a low axillary mass. this was evaluated at [facility]. I have all these reports. she had repeat mm on 1/22 that shows on left side there was focal asymmetry in lateral breast that on [place] is 1.4 cm cyst. on the right there is a 3x5x7 mm mass in the low axilla. 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   Procedure: After informed  consent was obtained the patient was taken to the operating room.  She underwent bilateral pectoral blocks.  She was given antibiotics.  SCDs were in place.  A Foley catheter was placed.  She was placed under general anesthesia without complication.  She was prepped and draped in the standard sterile surgical fashion.  A surgical timeout was then performed.  I first did the risk reducing left side.  I made an inframammary incision that started 9 cm from the xiphoid.  I carried this 10 cm laterally.  I then dissected the posterior flap after I first identified the pectoralis muscle and removed the pectoralis fascia the breast tissue from the muscle to the clavicle, parasternal area, and latissimus.  I then created the anterior flap with a combination of cautery and scissors.  I did this to the same margins.  The breast tissue was then removed and marked as above.  I then inverted the nipple and removed all of the tissue that entered into the nipple and sent this as a separate specimen.  Hemostasis was observed.  I packed this.  I then moved to the other side.  I first excised the axillary tumor and did the sentinel node biopsy.  I excised an ellipse of skin overlying the tumor and removed the palpable tumor with a clear margin. This was down to the axilla and the pectoralis muscle.  I marked this with suture short superior, long lateral.  I passed this off the table as a specimen. The sentinel nodes were part of this specimen. I did examine the remainder  of the axilla and the background radioactivity was 10.    I then obtained hemostasis.  I closed this with 2-0 Vicryl, 3-0 Vicryl, and 4-0 Monocryl. I then did the mastectomy on the right.   I made an inframammary incision on the right side that started 9 cm from the xiphoid and carried this 12 cm laterally.  I then dissected the posterior flap first after identifying the pectoralis muscle and removed the pectoralis fascia and the breast tissue from the muscle  to the clavicle, parasternal area, latissimus.  I then created the anterior flap with a combination of cautery and scissors  Eventually I was able to remove this breast tissue marked as above.  I then inverted the nipple and removed all the tissue that entered into the nipple and sent this as a separate specimen. Hemostasis was observed.  I packed this.the case was turned over to Dr Iran Planas

## 2020-10-12 NOTE — Interval H&P Note (Signed)
History and Physical Interval Note:  10/12/2020 11:43 AM  Pamela [name]  has presented today for surgery, with the diagnosis of BRCA POSITIVE BREAST CANCER.  The various methods of treatment have been discussed with the patient and family. After consideration of risks, benefits and other options for treatment, the patient has consented to  Procedure(s) with comments: [place] (Bilateral) - PEC BLOCK, RNFA BILATERAL BREAST RECONSTRUCTION WITH PLACEMENT OF TISSUE EXPANDER AND ALLODERM (Bilateral) as a surgical intervention.  The patient's history has been reviewed, patient examined, no change in status, stable for surgery.  I have reviewed the patient's chart and labs.  Questions were answered to the patient's satisfaction.     Rolm Bookbinder

## 2020-10-12 NOTE — Interval H&P Note (Signed)
History and Physical Interval Note:  10/12/2020 11:20 AM  Pamela [name]  has presented today for surgery, with the diagnosis of BRCA POSITIVE BREAST CANCER.  The various methods of treatment have been discussed with the patient and family. After consideration of risks, benefits and other options for treatment, the patient has consented to  Procedure(s) with comments: [place] (Bilateral) - PEC BLOCK, RNFA BILATERAL BREAST RECONSTRUCTION WITH PLACEMENT OF TISSUE EXPANDER AND ALLODERM (Bilateral) as a surgical intervention.  The patient's history has been reviewed, patient examined, no change in status, stable for surgery.  I have reviewed the patient's chart and labs.  Questions were answered to the patient's satisfaction.     Arnoldo Hooker Humzah Harty

## 2020-10-12 NOTE — Progress Notes (Signed)
Assisted Dr. Hodierne with right, left, ultrasound guided, pectoralis block. Side rails up, monitors on throughout procedure. See vital signs in flow sheet. Tolerated Procedure well. °

## 2020-10-12 NOTE — Transfer of Care (Signed)
Immediate Anesthesia Transfer of Care Note  Patient: [name]  Procedure(s) Performed: BILATERAL NIPPLE SPARING MASTECTOMY WITH RIGHT AXILLARY SENTINEL LYMPH NODE BIOPSY (Bilateral Breast) BILATERAL BREAST RECONSTRUCTION WITH PLACEMENT OF TISSUE EXPANDER AND ALLODERM (Bilateral Breast)  Patient Location: PACU  Anesthesia Type:GA combined with regional for post-op pain  Level of Consciousness: drowsy and patient cooperative  Airway & Oxygen Therapy: Patient Spontanous Breathing and Patient connected to face mask oxygen  Post-op Assessment: Report given to RN and Post -op Vital signs reviewed and stable  Post vital signs: Reviewed and stable  Last Vitals:  Vitals Value Taken Time  BP 122/83 10/12/20 1640  Temp    Pulse 93 10/12/20 1641  Resp 20 10/12/20 1641  SpO2 100 % 10/12/20 1641  Vitals shown include unvalidated device data.  Last Pain:  Vitals:   10/12/20 1036  TempSrc: Oral  PainSc: 0-No pain      Patients Stated Pain Goal: 5 (56/25/63 8937)  Complications: No complications documented.

## 2020-10-12 NOTE — Anesthesia Postprocedure Evaluation (Signed)
Anesthesia Post Note  Patient: DAVIANNA DEUTSCHMAN  Procedure(s) Performed: BILATERAL NIPPLE SPARING MASTECTOMY WITH RIGHT AXILLARY SENTINEL LYMPH NODE BIOPSY (Bilateral Breast) BILATERAL BREAST RECONSTRUCTION WITH PLACEMENT OF TISSUE EXPANDER AND ALLODERM (Bilateral Breast)     Patient location during evaluation: PACU Anesthesia Type: General Level of consciousness: awake and alert Pain management: pain level controlled Vital Signs Assessment: post-procedure vital signs reviewed and stable Respiratory status: spontaneous breathing, nonlabored ventilation and respiratory function stable Cardiovascular status: blood pressure returned to baseline and stable Postop Assessment: no apparent nausea or vomiting Anesthetic complications: no   No complications documented.  Last Vitals:  Vitals:   10/12/20 1640 10/12/20 1645  BP: 122/83 123/78  Pulse: 90 94  Resp: 20 (!) 21  Temp:    SpO2: 100% 100%    Last Pain:  Vitals:   10/12/20 1640  TempSrc:   PainSc: Asleep                 Lynda Rainwater

## 2020-10-12 NOTE — Anesthesia Procedure Notes (Signed)
Anesthesia Regional Block: Pectoralis block   Pre-Anesthetic Checklist: ,, timeout performed, Correct Patient, Correct Site, Correct Laterality, Correct Procedure, Correct Position, site marked, Risks and benefits discussed,  Surgical consent,  Pre-op evaluation,  At surgeon's request and post-op pain management  Laterality: Left  Prep: chloraprep       Needles:  Injection technique: Single-shot  Needle Type: Echogenic Needle     Needle Length: 9cm  Needle Gauge: 21     Additional Needles:   Narrative:  Start time: 10/12/2020 11:32 AM End time: 10/12/2020 11:39 AM Injection made incrementally with aspirations every 5 mL.  Performed by: Personally  Anesthesiologist: Albertha Ghee, MD  Additional Notes: Pt tolerated the procedure well.

## 2020-10-12 NOTE — Progress Notes (Signed)
Nuc med completed

## 2020-10-12 NOTE — Anesthesia Procedure Notes (Signed)
Procedure Name: Intubation Date/Time: 10/12/2020 12:18 PM Performed by: Willa Frater, CRNA Pre-anesthesia Checklist: Patient identified, Emergency Drugs available, Suction available and Patient being monitored Patient Re-evaluated:Patient Re-evaluated prior to induction Oxygen Delivery Method: Circle system utilized Preoxygenation: Pre-oxygenation with 100% oxygen Induction Type: IV induction Ventilation: Mask ventilation without difficulty Laryngoscope Size: Mac and 3 Grade View: Grade I Tube type: Oral Tube size: 7.0 mm Number of attempts: 1 Airway Equipment and Method: Stylet and Oral airway Placement Confirmation: ETT inserted through vocal cords under direct vision,  positive ETCO2 and breath sounds checked- equal and bilateral Secured at: 22 cm Tube secured with: Tape Dental Injury: Teeth and Oropharynx as per pre-operative assessment

## 2020-10-12 NOTE — Anesthesia Procedure Notes (Signed)
Anesthesia Regional Block: Pectoralis block   Pre-Anesthetic Checklist: ,, timeout performed, Correct Patient, Correct Site, Correct Laterality, Correct Procedure, Correct Position, site marked, Risks and benefits discussed,  Surgical consent,  Pre-op evaluation,  At surgeon's request and post-op pain management  Laterality: Right  Prep: chloraprep       Needles:  Injection technique: Single-shot  Needle Type: Echogenic Needle     Needle Length: 9cm  Needle Gauge: 21     Additional Needles:   Narrative:  Start time: 10/12/2020 11:25 AM End time: 10/12/2020 11:33 AM Injection made incrementally with aspirations every 5 mL.  Performed by: Personally  Anesthesiologist: Albertha Ghee, MD  Additional Notes: Pt tolerated the procedure well.

## 2020-10-12 NOTE — Anesthesia Preprocedure Evaluation (Signed)
Anesthesia Evaluation  Patient identified by MRN, date of birth, ID band Patient awake    Reviewed: Allergy & Precautions, H&P , NPO status , Patient's Chart, lab work & pertinent test results  Airway Mallampati: II   Neck ROM: full    Dental   Pulmonary former smoker,    breath sounds clear to auscultation       Cardiovascular negative cardio ROS   Rhythm:regular Rate:Normal     Neuro/Psych    GI/Hepatic   Endo/Other    Renal/GU      Musculoskeletal Cervical disc surgery   Abdominal   Peds  Hematology   Anesthesia Other Findings   Reproductive/Obstetrics Breast CA                             Anesthesia Physical Anesthesia Plan  ASA: II  Anesthesia Plan: General   Post-op Pain Management:  Regional for Post-op pain   Induction: Intravenous  PONV Risk Score and Plan: 3 and Ondansetron, Dexamethasone, Midazolam, Scopolamine patch - Pre-op and Treatment may vary due to age or medical condition  Airway Management Planned: Oral ETT  Additional Equipment:   Intra-op Plan:   Post-operative Plan: Extubation in OR  Informed Consent: I have reviewed the patients History and Physical, chart, labs and discussed the procedure including the risks, benefits and alternatives for the proposed anesthesia with the patient or authorized representative who has indicated his/her understanding and acceptance.     Dental advisory given  Plan Discussed with: CRNA, Anesthesiologist and Surgeon  Anesthesia Plan Comments:         Anesthesia Quick Evaluation

## 2020-10-13 ENCOUNTER — Encounter (HOSPITAL_BASED_OUTPATIENT_CLINIC_OR_DEPARTMENT_OTHER): Payer: Self-pay | Admitting: General Surgery

## 2020-10-13 DIAGNOSIS — C50411 Malignant neoplasm of upper-outer quadrant of right female breast: Secondary | ICD-10-CM | POA: Diagnosis not present

## 2020-10-13 NOTE — Discharge Summary (Signed)
Physician Discharge Summary  Patient ID: Pamela [name] MRN: [id number] DOB/AGE: 28-Sep-1975 45 y.o.  Admit date: 10/12/2020 Discharge date: 10/13/2020  Admission Diagnoses: Right breast cancer, BRCA1  Discharge Diagnoses:  same  Discharged Condition: stable  Hospital Course: Post operatively patient did well with pain controlled, tolerating diet, and ambulatory with minimal assist. Instructed on bathing drain care and compression.  Treatments: surgery: bilateral nipple sparing mastectomies right sentinel node bilateral breast reconstruction with tissue expanders acellular dermis 2.16.22  Discharge Exam: Blood pressure 112/69, pulse 70, temperature 98.2 F (36.8 C), resp. rate 18, height 5' 7"  (1.702 m), weight 66.1 kg, last menstrual period 09/09/2020, SpO2 100 %. Incision/Wound: chest soft bilateral incisions intact dry, Tegaderms in place, drains serosanguinous, skin flaps some ecchymoses without hematoma  Disposition: Discharge disposition: 01-Home or Self Care       Discharge Instructions    Call MD for:  redness, tenderness, or signs of infection (pain, swelling, bleeding, redness, odor or green/yellow discharge around incision site)   Complete by: As directed    Call MD for:  temperature >100.5   Complete by: As directed    Discharge instructions   Complete by: As directed    Ok to remove dressings and shower am 2.18.22. Soap and water ok, pat Tegaderms dry. Do not remove Tegaderms or steri strips. No creams or ointments over incisions. Do not let drains dangle in shower, attach to lanyard or similar.Strip and record drains twice daily and bring log to clinic visit.  Breast binder or soft compression bra all other times.  Ok to raise arms above shoulders for bathing and dressing.  No house yard work or exercise until cleared by MD.   Recommend ibuprofen with meals to aid with pain control. Also ok to use Tylenol for pain. Recommend Miralax or Dulcolax as needed for  constipation.   Driving Restrictions   Complete by: As directed    No driving for 2 weeks then no driving until cleared by MD   Lifting restrictions   Complete by: As directed    No lifting > 5 lbs until cleared by MD   Resume previous diet   Complete by: As directed        Follow-up Information    Irene Limbo, MD In 1 week.   Specialty: Plastic Surgery Why: as scheduled Contact information: [place] [address] [phone]        Rolm Bookbinder, MD. Schedule an appointment as soon as possible for a visit in 3 weeks.   Specialty: General Surgery Contact information: [address] [place] [address] [phone]               Signed: Irene Limbo 10/13/2020, 7:54 AM

## 2020-10-13 NOTE — Discharge Instructions (Addendum)
About my Jackson-Pratt Bulb Drain  What is a Jackson-Pratt bulb? A Jackson-Pratt is a soft, round device used to collect drainage. It is connected to a long, thin drainage catheter, which is held in place by one or two small stiches near your surgical incision site. When the bulb is squeezed, it forms a vacuum, forcing the drainage to empty into the bulb.  Emptying the Jackson-Pratt bulb- To empty the bulb: 1. Release the plug on the top of the bulb. 2. Pour the bulb's contents into a measuring container which your nurse will provide. 3. Record the time emptied and amount of drainage. Empty the drain(s) as often as your     doctor or nurse recommends.  Date                  Time                    Amount (Drain 1 left)                 Amount (Drain 2 left)____________________Amount (Drain 1 right)______________Amount (Drain 2  right)  _________________________________________________________________________________________________________________________________________________  _________________________________________________________________________________________________________________________________________________  _________________________________________________________________________________________________________________________________________________  _________________________________________________________________________________________________________________________________________________  _________________________________________________________________________________________________________________________________________________  _________________________________________________________________________________________________________________________________________________  _________________________________________________________________________________________________________________________________________________  _________________________________________________________________________________________________________________________________________________  Squeezing the Jackson-Pratt Bulb- To squeeze the bulb: 1. Make sure the plug at the top of the bulb is open. 2. Squeeze the bulb tightly in your fist. You will hear air squeezing from the bulb. 3. Replace the plug while the bulb is squeezed. 4. Use a safety pin to attach the bulb to your clothing. This will keep the catheter from     pulling at the bulb insertion site.  When to call your doctor- Call your doctor if:  Drain site becomes red, swollen or hot.  You have a fever greater than 101 degrees F.  There is oozing at the drain site.  Drain falls out (apply a guaze bandage over the drain hole and secure it with tape).  Drainage increases daily not related to activity patterns. (You will usually have more drainage  when you are active than when you are resting.)  Drainage has a bad odor.

## 2020-10-18 ENCOUNTER — Telehealth: Payer: Self-pay | Admitting: Hematology and Oncology

## 2020-10-18 NOTE — Assessment & Plan Note (Signed)
09/07/2020:Palpable right axillary mass.  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 Bilateral mastectomies and reconstruction: Rt axilla: Grade 1 , 0.7 cm IDC with DCIS, 0/4 LN Neg, Bil mastectomies: Benign , ER 85%, PR 96%, HER2 negative  2. No need of Oncotype DX testing  3. Adjuvant antiestrogen therapy  Tamoxifen Counseling: We discussed the risks and benefits of tamoxifen. These include but not limited to insomnia, hot flashes, mood changes, vaginal dryness, and weight gain. Although rare, serious side effects including endometrial cancer, risk of blood clots were also discussed. We strongly believe that the benefits far outweigh the risks. Patient understands these risks and consented to starting treatment. Planned treatment duration is 10 years.  RTC in 3 months for SCP visit

## 2020-10-18 NOTE — Progress Notes (Signed)
Patient Care Team: Jim Like, MD as PCP - General (Family Medicine) Mauro Kaufmann, RN as Oncology Nurse Navigator Rockwell Germany, RN as Oncology Nurse Navigator  DIAGNOSIS:    ICD-10-CM   1. Malignant neoplasm of lower-inner quadrant of right breast of female, estrogen receptor positive ([place])  C50.311    Z17.0     SUMMARY OF ONCOLOGIC HISTORY: Oncology History  Malignant neoplasm of lower-inner quadrant of right breast of female, estrogen receptor positive ([place])  09/07/2020 Initial Diagnosis   Palpable right axillary mass.  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      CHIEF COMPLIANT: Follow-up s/p bilateral mastectomies   INTERVAL HISTORY: Pamela [name] is a 45 y.o. with above-mentioned history of breast cancer and BRCA1 mutation. She underwent bilateral mastectomies with reconstruction on 10/12/20 with Dr. Donne Hazel and Dr. Iran Planas for which pathology showed in the left breast, no malignancy, and in the right breast, invasive and in situ ductal carcinoma, 0.7cm, clear margins, 4 right axillary lymph nodes negative for carcinoma. She presents to the clinic today to discuss the pathology report and further treatment.  Her major complaints today related to post operative pain as well as the acid reflux and nausea.  ALLERGIES:  is allergic to metronidazole, sulfamethoxazole-trimethoprim, baclofen, chlorhexidine, and wound dressing adhesive.  MEDICATIONS:  Current Outpatient Medications  Medication Sig Dispense Refill  . cyanocobalamin 1000 MCG tablet Take by mouth.     . cyclobenzaprine (FLEXERIL) 10 MG tablet Take 1 tablet (10 mg total) by mouth 3 (three) times daily as needed for muscle spasms. 20 tablet 0  . doxycycline (VIBRAMYCIN) 100 MG capsule Take 1 capsule (100 mg total) by mouth 2 (two) times daily. 12 capsule 0  . FLUoxetine (PROZAC) 40 MG capsule Take 40 mg by mouth at bedtime.    . nitrofurantoin, macrocrystal-monohydrate, (MACROBID) 100 MG capsule Use 1 tab po x 1 after intercourse    . oxyCODONE (ROXICODONE) 5 MG immediate release tablet Take 1 tablet (5 mg total) by mouth every 4 (four) hours as needed. 25 tablet 0   No current facility-administered medications for this visit.    PHYSICAL EXAMINATION: ECOG PERFORMANCE STATUS: 1 - Symptomatic but completely ambulatory  Vitals:   10/19/20 1021  BP: 118/85  Pulse: 100  Resp: 20  Temp: 97.9 F (36.6 C)  SpO2: 99%   Filed Weights   10/19/20 1021  Weight: 145 lb 9.6 oz (66 kg)     LABORATORY DATA:  I have reviewed the data as listed No flowsheet data found.  No results found for: WBC, HGB, HCT, MCV, PLT, NEUTROABS  ASSESSMENT & PLAN:  Malignant neoplasm of lower-inner quadrant of right breast of female, estrogen receptor positive ([place]) 09/07/2020:Palpable right axillary mass.  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 Bilateral mastectomies and reconstruction: Rt axilla: Grade 1 , 0.7 cm IDC with DCIS, 0/4 LN Neg, Bil  mastectomies: Benign , ER 85%, PR 96%, HER2 negative  2. No need of Oncotype DX testing  3. Adjuvant antiestrogen therapy  We are repeating the prognostic panel on the final pathology.  Tamoxifen Counseling: We discussed the risks and benefits of tamoxifen. These include but not limited to insomnia, hot flashes, mood changes, vaginal dryness, and weight gain. Although rare, serious side effects  including endometrial cancer, risk of blood clots were also discussed. We strongly believe that the benefits far outweigh the risks. Patient understands these risks and consented to starting treatment. Planned treatment duration is 10 years.  Acid reflux and nausea: I instructed her to take small portions of food multiple times in the day. RTC in 3 months for SCP visit  No orders of the defined types were placed in this encounter.  The patient has a good understanding of the overall plan. she agrees with it. she will call with any problems that may develop before the next visit here.  Total time spent: 30 mins including face to face time and time spent for planning, charting and coordination of care  Rulon Eisenmenger, MD, MPH 10/19/2020  I, Cloyde Reams Dorshimer, am acting as scribe for Dr. Nicholas Lose.  I have reviewed the above documentation for accuracy and completeness, and I agree with the above.

## 2020-10-18 NOTE — Telephone Encounter (Signed)
Scheduled appointment per 2/22 sch msg. Spoke to patient who is aware of appointment date and time.

## 2020-10-19 ENCOUNTER — Telehealth: Payer: Self-pay | Admitting: Hematology and Oncology

## 2020-10-19 ENCOUNTER — Inpatient Hospital Stay: Payer: BC Managed Care – PPO | Admitting: Hematology and Oncology

## 2020-10-19 ENCOUNTER — Other Ambulatory Visit: Payer: Self-pay

## 2020-10-19 DIAGNOSIS — Z170 Estrogen receptor positive status [ER+]: Secondary | ICD-10-CM

## 2020-10-19 DIAGNOSIS — C50311 Malignant neoplasm of lower-inner quadrant of right female breast: Secondary | ICD-10-CM

## 2020-10-19 DIAGNOSIS — Z1502 Genetic susceptibility to malignant neoplasm of ovary: Secondary | ICD-10-CM | POA: Diagnosis not present

## 2020-10-19 MED ORDER — TAMOXIFEN CITRATE 20 MG PO TABS: 20.0000 mg | ORAL_TABLET | Freq: Every day | ORAL | 3 refills | Status: DC

## 2020-10-19 NOTE — Telephone Encounter (Signed)
Scheduled appointment per 2/23 los. Spoke to patient who is aware of appointment date and time.

## 2020-10-20 ENCOUNTER — Encounter: Payer: Self-pay | Admitting: *Deleted

## 2020-10-20 LAB — SURGICAL PATHOLOGY

## 2020-11-03 ENCOUNTER — Other Ambulatory Visit: Payer: Self-pay

## 2020-11-03 ENCOUNTER — Encounter: Payer: Self-pay | Admitting: Physical Therapy

## 2020-11-03 ENCOUNTER — Ambulatory Visit: Payer: BC Managed Care – PPO | Attending: General Surgery | Admitting: Physical Therapy

## 2020-11-03 DIAGNOSIS — Z170 Estrogen receptor positive status [ER+]: Secondary | ICD-10-CM | POA: Insufficient documentation

## 2020-11-03 DIAGNOSIS — C50411 Malignant neoplasm of upper-outer quadrant of right female breast: Secondary | ICD-10-CM | POA: Insufficient documentation

## 2020-11-03 DIAGNOSIS — M25611 Stiffness of right shoulder, not elsewhere classified: Secondary | ICD-10-CM | POA: Diagnosis not present

## 2020-11-03 DIAGNOSIS — Z483 Aftercare following surgery for neoplasm: Secondary | ICD-10-CM | POA: Diagnosis present

## 2020-11-03 DIAGNOSIS — R293 Abnormal posture: Secondary | ICD-10-CM | POA: Insufficient documentation

## 2020-11-03 DIAGNOSIS — M25612 Stiffness of left shoulder, not elsewhere classified: Secondary | ICD-10-CM | POA: Insufficient documentation

## 2020-11-03 NOTE — Therapy (Signed)
[place], [place], [address] Phone: [phone]   Fax:  [phone]  Physical Therapy Treatment  Patient Details  Name: Pamela [name] MRN: [id number] Date of Birth: Nov 26, 1975 Referring Provider (PT): Dr. Donne Hazel   Encounter Date: 11/03/2020   PT End of Session - 11/03/20 1534    Visit Number 2    Number of Visits 10    Date for PT Re-Evaluation 12/01/20    PT Start Time 6283    PT Stop Time 1528    PT Time Calculation (min) 22 min    Activity Tolerance Patient tolerated treatment well    Behavior During Therapy [facility] for tasks assessed/performed           Past Medical History:  Diagnosis Date  . 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 Family history of BRCA1 gene positive   . Family history of breast cancer   . Family history of colon cancer   . Family history of prostate cancer     Past Surgical History:  Procedure Laterality Date  . BREAST RECONSTRUCTION WITH PLACEMENT OF TISSUE EXPANDER AND ALLODERM Bilateral 10/12/2020   Procedure: BILATERAL BREAST RECONSTRUCTION WITH PLACEMENT OF TISSUE EXPANDER AND ALLODERM;  Surgeon: Irene Limbo, MD;  Location: [place];  Service: Plastics;  Laterality: Bilateral;  . CERVICAL DISC SURGERY     ruptured disc neck with prosthetic replacement  . GANGLION CYST EXCISION Left    left wrist  . NIPPLE SPARING MASTECTOMY WITH SENTINEL LYMPH NODE BIOPSY Bilateral 10/12/2020   Procedure: BILATERAL NIPPLE SPARING MASTECTOMY WITH RIGHT AXILLARY SENTINEL LYMPH NODE BIOPSY;  Surgeon: Rolm Bookbinder, MD;  Location: [place];  Service: General;  Laterality: Bilateral;  [place], RNFA  . TUBAL LIGATION      There were no vitals filed for this visit.   Subjective Assessment - 11/03/20 1507    Subjective I am very sore. I just had the expanders filled.    Pertinent History 10/12/20 bilateral nipple sparing mastectomy with  R SLNB (4 nodes all negative) for treatment of R breast cancer that is ER/PR+,HER2-, BRCA 1 positive, plan is to have total hysterectomy on 02/22/21    Patient Stated Goals to gain info from provider    Currently in Pain? No/denies    Pain Score 0-No pain              [facility] PT Assessment - 11/03/20 0001      Assessment   Medical Diagnosis R breast cancer    Referring Provider (PT) Dr. Donne Hazel    Onset Date/Surgical Date 10/12/20      Observation/Other Assessments   Observations pt guarding chest after recent fill of expanders    Skin Integrity incisions are healing bilaterally, no post op edema noted      AROM   Right Shoulder Extension 49 Degrees    Right Shoulder Flexion 101 Degrees    Right Shoulder ABduction 75 Degrees    Right Shoulder Internal Rotation 59 Degrees    Right Shoulder External Rotation 79 Degrees    Left Shoulder Extension 55 Degrees    Left Shoulder Flexion 105 Degrees    Left Shoulder ABduction 81 Degrees    Left Shoulder Internal Rotation 58 Degrees    Left Shoulder External Rotation 60 Degrees             LYMPHEDEMA/ONCOLOGY QUESTIONNAIRE - 11/03/20 0001      Type   Cancer Type  right breast cancer      Surgeries   Mastectomy Date 10/12/20    Sentinel Lymph Node Biopsy Date 10/12/20    Number Lymph Nodes Removed 4   all negative     Right Upper Extremity Lymphedema   15 cm Proximal to Olecranon Process 27.6 cm    Olecranon Process 25.7 cm    15 cm Proximal to Ulnar Styloid Process 23.5 cm    Just Proximal to Ulnar Styloid Process 15.5 cm    Across Hand at [employer] 18 cm    At [place] of 2nd Digit 6 cm      Left Upper Extremity Lymphedema   15 cm Proximal to Olecranon Process 26.8 cm    Olecranon Process 24.6 cm    15 cm Proximal to Ulnar Styloid Process 22 cm    Just Proximal to Ulnar Styloid Process 14.8 cm    Across Hand at [employer] 18 cm    At [place] of 2nd Digit 5.6 cm                                    PT Long Term Goals - 11/03/20 1539      PT LONG TERM GOAL #1   Title Pt will return to baseline ROM measurements and not demonstrate any signs/symptoms of lymphedema.    Time 8    Period Weeks    Status On-going      PT LONG TERM GOAL #2   Title Pt will demonstrate 170 degrees of bilateral shoulder flexion to allow pt to reach overhead.    Baseline R 101, L 75    Time 4    Period Weeks    Status New    Target Date 12/01/20      PT LONG TERM GOAL #3   Title Pt will demonstrate 170 degrees of bilateral shoulder abduction to allow her to reach out to the side    Baseline R 75, L 81    Time 4    Period Weeks    Status New    Target Date 12/01/20      PT LONG TERM GOAL #4   Title Pt will be independent in a home exercise program for continued strengthening and stretching.    Time 4    Period Weeks    Status New    Target Date 12/01/20                 Plan - 11/03/20 1534    Clinical Impression Statement Pt reports to PT for post surgical follow up. She underwent bilateral nipple sparing mastectomies on 10/12/20 with R SLNB. She tested positive for BRCA 1 and will also undergo a hysterectomy on 02/22/21. She had expanders placed and underwent her first fill today so she was more sore. Pt has not started her post op exercises because her plastic surgeon advised her to wait a week until the last drain was removed which was a week ago today. Pt demonstrates decreased bilateral shoulder ROM with discomfort in bilateral axilla during ROM. No post op edema noted today. Pt educated to begin post op exercises tomorrow to give her a little time to heal after her fill today. Pt would benefit from skilled PT services to improve bilateral shoulder ROM and increased strength and improve scar mobility.    PT Frequency 2x / week    PT Duration 4  weeks    PT Treatment/Interventions ADLs/Self Care Home Management;Patient/family  education;Therapeutic exercise;Manual techniques;Passive range of motion    PT Next Visit Plan begin PROM to bilateral shoulders, do pulleys and ball up wall eventually    PT Home Exercise Plan post op shoulder ROM    Consulted and Agree with Plan of Care Patient           Patient will benefit from skilled therapeutic intervention in order to improve the following deficits and impairments:  Postural dysfunction,Decreased knowledge of precautions,Impaired UE functional use,Impaired flexibility,Increased fascial restricitons,Decreased strength,Decreased range of motion  Visit Diagnosis: Stiffness of right shoulder, not elsewhere classified  Stiffness of left shoulder, not elsewhere classified  Aftercare following surgery for neoplasm  Abnormal posture  Malignant neoplasm of upper-outer quadrant of right breast in female, estrogen receptor positive ([place])     Problem List Patient Active Problem List   Diagnosis Date Noted  . Breast cancer, right ([place]) 10/12/2020  . History of cervical dysplasia 10/06/2020  . Malignant neoplasm of lower-inner quadrant of right breast of female, estrogen receptor positive ([place]) 09/16/2020  . 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 Family history of BRCA1 gene positive   . Family history of breast cancer   . Family history of prostate cancer   . Family history of colon cancer     Allyson Sabal [facility] 11/03/2020, 3:41 PM  [place], [place], [address] Phone: [phone]   Fax:  [phone]  Name: Pamela [name] MRN: [id number] Date of Birth: 30-Dec-1975  Manus Gunning, PT 11/03/20 3:41 PM

## 2020-11-14 ENCOUNTER — Ambulatory Visit: Payer: BC Managed Care – PPO

## 2020-11-14 ENCOUNTER — Other Ambulatory Visit: Payer: Self-pay

## 2020-11-14 DIAGNOSIS — Z483 Aftercare following surgery for neoplasm: Secondary | ICD-10-CM

## 2020-11-14 DIAGNOSIS — C50411 Malignant neoplasm of upper-outer quadrant of right female breast: Secondary | ICD-10-CM

## 2020-11-14 DIAGNOSIS — M25611 Stiffness of right shoulder, not elsewhere classified: Secondary | ICD-10-CM

## 2020-11-14 DIAGNOSIS — R293 Abnormal posture: Secondary | ICD-10-CM

## 2020-11-14 DIAGNOSIS — Z170 Estrogen receptor positive status [ER+]: Secondary | ICD-10-CM

## 2020-11-14 DIAGNOSIS — M25612 Stiffness of left shoulder, not elsewhere classified: Secondary | ICD-10-CM

## 2020-11-14 NOTE — Therapy (Signed)
[place], [place], [address] Phone: [phone]   Fax:  [phone]  Physical Therapy Treatment  Patient Details  Name: Pamela [name] MRN: [id number] Date of Birth: 07-Jul-1976 Referring Provider (PT): Dr. Donne Hazel   Encounter Date: 11/14/2020   PT End of Session - 11/14/20 1604    Visit Number 3    Number of Visits 10    Date for PT Re-Evaluation 12/01/20    PT Start Time 1507   pt arrived late   PT Stop Time 1606    PT Time Calculation (min) 59 min    Activity Tolerance Patient tolerated treatment well    Behavior During Therapy [facility] for tasks assessed/performed           Past Medical History:  Diagnosis Date  . 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 Family history of BRCA1 gene positive   . Family history of breast cancer   . Family history of colon cancer   . Family history of prostate cancer     Past Surgical History:  Procedure Laterality Date  . BREAST RECONSTRUCTION WITH PLACEMENT OF TISSUE EXPANDER AND ALLODERM Bilateral 10/12/2020   Procedure: BILATERAL BREAST RECONSTRUCTION WITH PLACEMENT OF TISSUE EXPANDER AND ALLODERM;  Surgeon: Irene Limbo, MD;  Location: [place];  [name]: Plastics;  Laterality: Bilateral;  . CERVICAL DISC SURGERY     ruptured disc neck with prosthetic replacement  . GANGLION CYST EXCISION Left    left wrist  . NIPPLE SPARING MASTECTOMY WITH SENTINEL LYMPH NODE BIOPSY Bilateral 10/12/2020   Procedure: BILATERAL NIPPLE SPARING MASTECTOMY WITH RIGHT AXILLARY SENTINEL LYMPH NODE BIOPSY;  Surgeon: Rolm Bookbinder, MD;  Location: [place];  [name]: General;  Laterality: Bilateral;  [place], RNFA  . TUBAL LIGATION      There were no vitals filed for this visit.   Subjective Assessment - 11/14/20 1508    Subjective I've been doing my exercises that Pamela [name] gave me last time and they are going good. I am getting  better each day. The tightness is better from my fill and I'm done with those.    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 No/denies                             [facility] Adult PT Treatment/Exercise - 11/14/20 0001      Shoulder Exercises: Pulleys   Flexion 2 minutes    Flexion Limitations Pt returned therapist demo and VCs to relax shoulders throughout    ABduction 2 minutes    ABduction Limitations Pt returned therapist demo and VCs to relax Rt>Lt shoulder      Shoulder Exercises: Therapy Ball   Flexion Both;10 reps   forward lean into end of stretch     Manual Therapy   Manual Therapy Myofascial release;Passive ROM    Myofascial Release To bil axillae during P/ROM    Passive ROM In Supine to bil shoulders into flexion, abduction and D2 to pts tolerance                       PT Long Term Goals -  11/03/20 1539      PT LONG TERM GOAL #1   Title Pt will return to baseline ROM measurements and not demonstrate any signs/symptoms of lymphedema.    Time 8    Period Weeks    Status On-going      PT LONG TERM GOAL #2   Title Pt will demonstrate 170 degrees of bilateral shoulder flexion to allow pt to reach overhead.    Baseline R 101, L 75    Time 4    Period Weeks    Status New    Target Date 12/01/20      PT LONG TERM GOAL #3   Title Pt will demonstrate 170 degrees of bilateral shoulder abduction to allow her to reach out to the side    Baseline R 75, L 81    Time 4    Period Weeks    Status New    Target Date 12/01/20      PT LONG TERM GOAL #4   Title Pt will be independent in a home exercise program for continued strengthening and stretching.    Time 4    Period Weeks    Status New    Target Date  12/01/20                 Plan - 11/14/20 1605    Clinical Impression Statement Focused on manual therapy to bil shoulders. VCs throughout to relax but pt able to do so with cuing. Also included MFR to bil axillae, especially at [facility]. P/ROM was  improved by end of session. Also started pt on AA/ROM with pulleys and ball rol up wall. She tolerated both well with out increased pain, just required multiple VCs to relax shoulders throughout.    Stability/Clinical Decision Making Stable/Uncomplicated    Rehab Potential Good    PT Frequency 2x / week    PT Duration 4 weeks    PT Treatment/Interventions ADLs/Self Care Home Management;Patient/family education;Therapeutic exercise;Manual techniques;Passive range of motion    PT Next Visit Plan Cont PROM to bilateral shoulders, add scapular mobs and STM to Rt lateral trunk where c/o tightness at end of todays session; cont pulleys and ball up wall    PT Home Exercise Plan post op shoulder ROM    Consulted and Agree with Plan of Care Patient           Patient will benefit from skilled therapeutic intervention in order to improve the following deficits and impairments:  Postural dysfunction,Decreased knowledge of precautions,Impaired UE functional use,Impaired flexibility,Increased fascial restricitons,Decreased strength,Decreased range of motion  Visit Diagnosis: Stiffness of right shoulder, not elsewhere classified  Stiffness of left shoulder, not elsewhere classified  Aftercare following surgery for neoplasm  Abnormal posture  Malignant neoplasm of upper-outer quadrant of right breast in female, estrogen receptor positive ([place])     Problem List Patient Active Problem List   Diagnosis Date Noted  . Breast cancer, right ([place]) 10/12/2020  . History of cervical dysplasia 10/06/2020  . Malignant neoplasm of lower-inner quadrant of right breast of female, estrogen receptor positive ([place]) 09/16/2020  . BRCA1 gene mutation positive in  female   . Family history of BRCA1 gene positive   . Family history of breast cancer   . Family history of prostate cancer   . Family history of colon cancer     Otelia Limes, PTA 11/14/2020, 4:13 PM  [place], [place], [address] Phone: [phone]  Fax:  [phone]  Name: Pamela [name] MRN: [id number] Date of Birth: September 29, 1975

## 2020-11-17 ENCOUNTER — Other Ambulatory Visit: Payer: Self-pay

## 2020-11-17 ENCOUNTER — Ambulatory Visit: Payer: BC Managed Care – PPO

## 2020-11-17 DIAGNOSIS — C50411 Malignant neoplasm of upper-outer quadrant of right female breast: Secondary | ICD-10-CM

## 2020-11-17 DIAGNOSIS — M25611 Stiffness of right shoulder, not elsewhere classified: Secondary | ICD-10-CM

## 2020-11-17 DIAGNOSIS — R293 Abnormal posture: Secondary | ICD-10-CM

## 2020-11-17 DIAGNOSIS — M25612 Stiffness of left shoulder, not elsewhere classified: Secondary | ICD-10-CM

## 2020-11-17 DIAGNOSIS — Z483 Aftercare following surgery for neoplasm: Secondary | ICD-10-CM

## 2020-11-17 DIAGNOSIS — Z170 Estrogen receptor positive status [ER+]: Secondary | ICD-10-CM

## 2020-11-17 NOTE — Patient Instructions (Signed)
Supine on rolled up yoga mat with knees bent for:  1. Horizontal abduction (start with hands pointed at ceiling and open wide working arms towards floor, then bring back together) 2. Make "V" starting with hands together over waist then bring arms up making legs of the "V"   Do these each 10 reps trying to increase motion with each rep.  3. Make a slow "snow angel" starting with palms up and slide arms out on floor until stretch felt at chest . Do for up to 2-5 mins  Cancer Rehab [phone]

## 2020-11-17 NOTE — Therapy (Signed)
[place], [place], [address] Phone: [phone]   Fax:  [phone]  Physical Therapy Treatment  Patient Details  Name: Pamela [name] MRN: [id number] Date of Birth: Mar 31, 1976 Referring Provider (PT): Dr. Donne Hazel   Encounter Date: 11/17/2020   PT End of Session - 11/17/20 1007    Visit Number 4    Number of Visits 10    Date for PT Re-Evaluation 12/01/20    PT Start Time 0907    PT Stop Time 1005    PT Time Calculation (min) 58 min    Activity Tolerance Patient tolerated treatment well    Behavior During Therapy [facility] for tasks assessed/performed           Past Medical History:  Diagnosis Date  . 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 Family history of BRCA1 gene positive   . Family history of breast cancer   . Family history of colon cancer   . Family history of prostate cancer     Past Surgical History:  Procedure Laterality Date  . BREAST RECONSTRUCTION WITH PLACEMENT OF TISSUE EXPANDER AND ALLODERM Bilateral 10/12/2020   Procedure: BILATERAL BREAST RECONSTRUCTION WITH PLACEMENT OF TISSUE EXPANDER AND ALLODERM;  Surgeon: Irene Limbo, MD;  Location: [facility];  Service: Plastics;  Laterality: Bilateral;  . CERVICAL DISC SURGERY     ruptured disc neck with prosthetic replacement  . GANGLION CYST EXCISION Left    left wrist  . NIPPLE SPARING MASTECTOMY WITH SENTINEL LYMPH NODE BIOPSY Bilateral 10/12/2020   Procedure: BILATERAL NIPPLE SPARING MASTECTOMY WITH RIGHT AXILLARY SENTINEL LYMPH NODE BIOPSY;  Surgeon: Rolm Bookbinder, MD;  Location: [facility];  Service: General;  Laterality: Bilateral;  [place], RNFA  . TUBAL LIGATION      There were no vitals filed for this visit.   Subjective Assessment - 11/17/20 0910    Subjective I was just a little sore after last visit but I expected that. I've been doing the HEP stretches at home and am slowly  able to get a little further each time. I've also been working on incorporating the stretches throughout day. I'm going back to work next week for the next 6 weeks until my implant exchange. I don't do alot of lifting so it should go well.    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 No/denies                             [facility] Adult PT Treatment/Exercise - 11/17/20 0001      Shoulder Exercises: Supine   Other Supine Exercises Supine over towel roll along thoracic spine for bil horz abd x5, then scaption into a "V" x5 then instructed pt in how to do a "snow angel" at home working on incresasing abduction with slow, deep breaths      Manual Therapy   Manual Therapy Myofascial release;Passive ROM;Soft tissue mobilization    Soft tissue mobilization In Lt S/L to Rt medial scapular broder for multiple trigger points, also to Rt UT and lateral trunk    Myofascial Release To bil axillae during P/ROM, mutliple small pops felt near [facility]  incision    Passive ROM In Supine to bil shoulders into flexion, abduction and D2 to pts tolerance                  PT Education - 11/17/20 1035    Education Details Encouraged pt to continue post op stretches and today added supine over towel roll for bil UE A/ROM working to improve scapular mobility    Person(s) Educated Patient    Methods Explanation;Demonstration;Handout    Comprehension Verbalized understanding;Returned demonstration               PT Long Term Goals - 11/03/20 1539      PT LONG TERM GOAL #1   Title Pt will return to baseline ROM measurements and not demonstrate any signs/symptoms of lymphedema.    Time 8    Period Weeks    Status On-going      PT LONG TERM GOAL #2    Title Pt will demonstrate 170 degrees of bilateral shoulder flexion to allow pt to reach overhead.    Baseline R 101, L 75    Time 4    Period Weeks    Status New    Target Date 12/01/20      PT LONG TERM GOAL #3   Title Pt will demonstrate 170 degrees of bilateral shoulder abduction to allow her to reach out to the side    Baseline R 75, L 81    Time 4    Period Weeks    Status New    Target Date 12/01/20      PT LONG TERM GOAL #4   Title Pt will be independent in a home exercise program for continued strengthening and stretching.    Time 4    Period Weeks    Status New    Target Date 12/01/20                 Plan - 11/17/20 1036    Clinical Impression Statement Continued with manual therapy to bil upper quadrants. When performing MFR to cording at Rt axilla a few small pops noted by therapist at SLNB incision and scar tissue was less adhesed after. Also added scapular mobs today as her Rt scapular mobility is limited and pt reports discomfort at superior aspect of shoulder at end flexion. This was decreased with scapular depression during P/ROM. Progressed HEP to include supine over towel roll for bil UE and scapular mobility. Pt reports noticing overall has general upper quadrant tightness has improved since starting therapy and that her Rt side didn't "feel part of her body" but now she feels it does as her motion returns more every day.    Stability/Clinical Decision Making Stable/Uncomplicated    Rehab Potential Good    PT Frequency 2x / week    PT Duration 4 weeks    PT Treatment/Interventions ADLs/Self Care Home Management;Patient/family education;Therapeutic exercise;Manual techniques;Passive range of motion    PT Next Visit Plan Cont PROM to bilateral shoulders and review new exercises issued today; cont pulleys and ball up wall; once ROM improves progress pt to supine scapular series    PT Home Exercise Plan post op shoulder ROM; supine over towel roll for bil  UE's and scapular mobility    Consulted and Agree with Plan of Care Patient           Patient will benefit from skilled therapeutic intervention in order to improve the following deficits and impairments:  Postural dysfunction,Decreased knowledge of precautions,Impaired  UE functional use,Impaired flexibility,Increased fascial restricitons,Decreased strength,Decreased range of motion  Visit Diagnosis: Stiffness of right shoulder, not elsewhere classified  Stiffness of left shoulder, not elsewhere classified  Aftercare following surgery for neoplasm  Abnormal posture  Malignant neoplasm of upper-outer quadrant of right breast in female, estrogen receptor positive ([place])     Problem List Patient Active Problem List   Diagnosis Date Noted  . Breast cancer, right ([place]) 10/12/2020  . History of cervical dysplasia 10/06/2020  . Malignant neoplasm of lower-inner quadrant of right breast of female, estrogen receptor positive ([place]) 09/16/2020  . 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 Family history of BRCA1 gene positive   . Family history of breast cancer   . Family history of prostate cancer   . Family history of colon cancer     Otelia Limes, PTA 11/17/2020, 1:00 PM  [place], [place], [address] Phone: [phone]   Fax:  [phone]  Name: ROYETTA PROBUS MRN: [id number] Date of Birth: September 08, 1975

## 2020-11-21 ENCOUNTER — Other Ambulatory Visit: Payer: Self-pay

## 2020-11-21 ENCOUNTER — Ambulatory Visit: Payer: BC Managed Care – PPO | Admitting: Physical Therapy

## 2020-11-21 ENCOUNTER — Encounter: Payer: Self-pay | Admitting: Physical Therapy

## 2020-11-21 DIAGNOSIS — M25612 Stiffness of left shoulder, not elsewhere classified: Secondary | ICD-10-CM

## 2020-11-21 DIAGNOSIS — M25611 Stiffness of right shoulder, not elsewhere classified: Secondary | ICD-10-CM | POA: Diagnosis not present

## 2020-11-21 DIAGNOSIS — R293 Abnormal posture: Secondary | ICD-10-CM

## 2020-11-21 DIAGNOSIS — Z483 Aftercare following surgery for neoplasm: Secondary | ICD-10-CM

## 2020-11-21 NOTE — Therapy (Signed)
[place], [place], [address] Phone: [phone]   Fax:  [phone]  Physical Therapy Treatment  Patient Details  Name: Pamela [name] MRN: [id number] Date of Birth: 06/15/1976 Referring Provider (PT): Dr. Donne Hazel   Encounter Date: 11/21/2020   PT End of Session - 11/21/20 0900    Visit Number 5    Number of Visits 10    Date for PT Re-Evaluation 12/01/20    PT Start Time 0803    PT Stop Time 0626    PT Time Calculation (min) 54 min    Activity Tolerance Patient tolerated treatment well    Behavior During Therapy [facility] for tasks assessed/performed           Past Medical History:  Diagnosis Date  . 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 Family history of BRCA1 gene positive   . Family history of breast cancer   . Family history of colon cancer   . Family history of prostate cancer     Past Surgical History:  Procedure Laterality Date  . BREAST RECONSTRUCTION WITH PLACEMENT OF TISSUE EXPANDER AND ALLODERM Bilateral 10/12/2020   Procedure: BILATERAL BREAST RECONSTRUCTION WITH PLACEMENT OF TISSUE EXPANDER AND ALLODERM;  Surgeon: Irene Limbo, MD;  Location: [facility];  Service: Plastics;  Laterality: Bilateral;  . CERVICAL DISC SURGERY     ruptured disc neck with prosthetic replacement  . GANGLION CYST EXCISION Left    left wrist  . NIPPLE SPARING MASTECTOMY WITH SENTINEL LYMPH NODE BIOPSY Bilateral 10/12/2020   Procedure: BILATERAL NIPPLE SPARING MASTECTOMY WITH RIGHT AXILLARY SENTINEL LYMPH NODE BIOPSY;  Surgeon: Rolm Bookbinder, MD;  Location: [facility];  Service: General;  Laterality: Bilateral;  [place], RNFA  . TUBAL LIGATION      There were no vitals filed for this visit.   Subjective Assessment - 11/21/20 0812    Subjective I am ready to get back to normal.    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 Yes    Pain Score 1     Pain Location Rib cage    Pain Orientation Right    Pain Descriptors / Indicators Tightness    Pain Type Acute pain;Surgical pain    Pain Onset 1 to 4 weeks ago    Pain Frequency Constant    Aggravating Factors  turning a certain way    Pain Relieving Factors rest    Effect of Pain on Daily Activities no effect                             [facility] Adult PT Treatment/Exercise - 11/21/20 0001      Manual Therapy   Soft tissue mobilization In Lt S/L to Rt medial scapular border and along wing of lats for multiple trigger points, also to Rt UT and lateral trunk    Myofascial Release To bil axillae during P/ROM    Passive ROM In Supine to bil shoulders into flexion, abduction and D2 to pts tolerance                       PT Long Term  Goals - 11/03/20 1539      PT LONG TERM GOAL #1   Title Pt will return to baseline ROM measurements and not demonstrate any signs/symptoms of lymphedema.    Time 8    Period Weeks    Status On-going      PT LONG TERM GOAL #2   Title Pt will demonstrate 170 degrees of bilateral shoulder flexion to allow pt to reach overhead.    Baseline R 101, L 75    Time 4    Period Weeks    Status New    Target Date 12/01/20      PT LONG TERM GOAL #3   Title Pt will demonstrate 170 degrees of bilateral shoulder abduction to allow her to reach out to the side    Baseline R 75, L 81    Time 4    Period Weeks    Status New    Target Date 12/01/20      PT LONG TERM GOAL #4   Title Pt will be independent in a home exercise program for continued strengthening and stretching.    Time 4    Period Weeks    Status New    Target Date 12/01/20                 Plan - 11/21/20 0901    Clinical  Impression Statement Continued with manual therapy to improve bilateral shoulder ROM with focus on R side especially with manual therapy to decrease muscle tightness in R lats, serratus and upper traps. Pt has impingement like pain especially at end range and would benefit from scapular strengthening exercises. Educated pt to continue with stretching at home. Will instruct pt in supine scapular strengthening exercises at next session. No cording palpable today but increased tightness noted along bilateral pecs.    PT Frequency 2x / week    PT Duration 4 weeks    PT Treatment/Interventions ADLs/Self Care Home Management;Patient/family education;Therapeutic exercise;Manual techniques;Passive range of motion    PT Next Visit Plan instruct pt in supine scap series, do scap mobs to R scap, Cont PROM to bilateral shoulders and review new exercises issued today; cont pulleys and ball up wall; once ROM improves progress pt to supine scapular series    PT Home Exercise Plan post op shoulder ROM; supine over towel roll for bil UE's and scapular mobility    Consulted and Agree with Plan of Care Patient           Patient will benefit from skilled therapeutic intervention in order to improve the following deficits and impairments:  Postural dysfunction,Decreased knowledge of precautions,Impaired UE functional use,Impaired flexibility,Increased fascial restricitons,Decreased strength,Decreased range of motion  Visit Diagnosis: Stiffness of right shoulder, not elsewhere classified  Stiffness of left shoulder, not elsewhere classified  Aftercare following surgery for neoplasm  Abnormal posture     Problem List Patient Active Problem List   Diagnosis Date Noted  . Breast cancer, right ([place]) 10/12/2020  . History of cervical dysplasia 10/06/2020  . Malignant neoplasm of lower-inner quadrant of right breast of female, estrogen receptor positive ([place]) 09/16/2020  . BRCA1 gene mutation positive in female    . Family history of BRCA1 gene positive   . Family history of breast cancer   . Family history of prostate cancer   . Family history of colon cancer     Allyson Sabal [facility] 11/21/2020, 9:04 AM  [place], [place],  [address] Phone: [phone]   Fax:  [phone]  Name: Pamela [name] MRN: [id number] Date of Birth: 26-Sep-1975  Manus Gunning, PT 11/21/20 9:04 AM

## 2020-11-24 ENCOUNTER — Other Ambulatory Visit: Payer: Self-pay

## 2020-11-24 ENCOUNTER — Ambulatory Visit: Payer: BC Managed Care – PPO

## 2020-11-24 DIAGNOSIS — M25611 Stiffness of right shoulder, not elsewhere classified: Secondary | ICD-10-CM

## 2020-11-24 DIAGNOSIS — Z483 Aftercare following surgery for neoplasm: Secondary | ICD-10-CM

## 2020-11-24 DIAGNOSIS — R293 Abnormal posture: Secondary | ICD-10-CM

## 2020-11-24 DIAGNOSIS — M25612 Stiffness of left shoulder, not elsewhere classified: Secondary | ICD-10-CM

## 2020-11-24 DIAGNOSIS — C50411 Malignant neoplasm of upper-outer quadrant of right female breast: Secondary | ICD-10-CM

## 2020-11-24 NOTE — Therapy (Signed)
[place], [place], [address] Phone: [phone]   Fax:  [phone]  Physical Therapy Treatment  Patient Details  Name: Pamela [name] MRN: [id number] Date of Birth: [birth year] Referring Provider (PT): Dr. Donne Hazel   Encounter Date: 11/24/2020   PT End of Session - 11/24/20 0911    Visit Number 6    Number of Visits 10    Date for PT Re-Evaluation 12/01/20    PT Start Time 0906    PT Stop Time 1002    PT Time Calculation (min) 56 min    Activity Tolerance Patient tolerated treatment well    Behavior During Therapy [facility] for tasks assessed/performed           Past Medical History:  Diagnosis Date  . 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 Family history of BRCA1 gene positive   . Family history of breast cancer   . Family history of colon cancer   . Family history of prostate cancer     Past Surgical History:  Procedure Laterality Date  . BREAST RECONSTRUCTION WITH PLACEMENT OF TISSUE EXPANDER AND ALLODERM Bilateral 10/12/2020   Procedure: BILATERAL BREAST RECONSTRUCTION WITH PLACEMENT OF TISSUE EXPANDER AND ALLODERM;  Surgeon: Irene Limbo, MD;  Location: [facility];  Service: Plastics;  Laterality: Bilateral;  . CERVICAL DISC SURGERY     ruptured disc neck with prosthetic replacement  . GANGLION CYST EXCISION Left    left wrist  . NIPPLE SPARING MASTECTOMY WITH SENTINEL LYMPH NODE BIOPSY Bilateral 10/12/2020   Procedure: BILATERAL NIPPLE SPARING MASTECTOMY WITH RIGHT AXILLARY SENTINEL LYMPH NODE BIOPSY;  Surgeon: Rolm Bookbinder, MD;  Location: [facility];  Service: General;  Laterality: Bilateral;  [place], RNFA  . TUBAL LIGATION      There were no vitals filed for this visit.   Subjective Assessment - 11/24/20 0908    Subjective I really want to get back to work and plan to next week, but I can tell I still really need physical therapy because my  progress is not where I want it to be so I'm just going to keep paying for gas and come 2x/wk.    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 No/denies                             [facility] Adult PT Treatment/Exercise - 11/24/20 0001      Shoulder Exercises: Supine   Horizontal ABduction Strengthening;Both;10 reps;Theraband    Theraband Level (Shoulder Horizontal ABduction) Level 1 (Yellow)    Horizontal ABduction Limitations Pt returned therapist demo for all supine scapular exs    External Rotation Strengthening;Both;10 reps;Theraband    Theraband Level (Shoulder External Rotation) Level 1 (Yellow)    Flexion Strengthening;Both;10 reps;Theraband   Narrow and Wdie Grip, 10x each   Theraband Level (Shoulder Flexion) Level 1 (Yellow)    Diagonals Strengthening;Right;Left;10 reps;Theraband    Theraband Level (Shoulder Diagonals) Level 1 (Yellow)      Manual Therapy   Manual Therapy Myofascial release;Passive ROM;Soft tissue mobilization    Soft tissue mobilization --    Myofascial Release To bil axillae during P/ROM  Passive ROM In Supine to bil shoulders into flexion, abduction and D2 to pts tolerance                  PT Education - 11/24/20 0918    Education Details Supine scapular series with yellow theraband    Person(s) Educated Patient    Methods Explanation;Demonstration;Handout    Comprehension Verbalized understanding;Returned demonstration;Need further instruction               PT Long Term Goals - 11/03/20 1539      PT LONG TERM GOAL #1   Title Pt will return to baseline ROM measurements and not demonstrate any signs/symptoms of lymphedema.    Time 8    Period Weeks    Status On-going      PT  LONG TERM GOAL #2   Title Pt will demonstrate 170 degrees of bilateral shoulder flexion to allow pt to reach overhead.    Baseline R 101, L 75    Time 4    Period Weeks    Status New    Target Date 12/01/20      PT LONG TERM GOAL #3   Title Pt will demonstrate 170 degrees of bilateral shoulder abduction to allow her to reach out to the side    Baseline R 75, L 81    Time 4    Period Weeks    Status New    Target Date 12/01/20      PT LONG TERM GOAL #4   Title Pt will be independent in a home exercise program for continued strengthening and stretching.    Time 4    Period Weeks    Status New    Target Date 12/01/20                 Plan - 11/24/20 0911    Clinical Impression Statement Progressed pts HEP today to include supine scapular series which pt tolerated very well. She was able to return demo of each exericse andhad no c/o pain, just some tightness at end range flexion and D2 motions. Also continued with manual therapy focusing on progressing her end P/ROM and decreasing her limitng pectoralis tightness. Ovreall pt seems to be slowly progressing well and hopes to return to work next week but would like to cont therapy 2x/wk for now so help her further the gains she has made thus far.    Stability/Clinical Decision Making Stable/Uncomplicated    PT Frequency 2x / week    PT Duration 4 weeks    PT Treatment/Interventions ADLs/Self Care Home Management;Patient/family education;Therapeutic exercise;Manual techniques;Passive range of motion    PT Next Visit Plan Review supine scap series, do scap mobs to R scap, Cont PROM to bilateral shoulders and review new exercises issued today; cont pulleys and ball up wall    PT Home Exercise Plan post op shoulder ROM; supine over towel roll for bil UE's and scapular mobility; supine scapular series with yellow theraband    Consulted and Agree with Plan of Care Patient           Patient will benefit from skilled therapeutic  intervention in order to improve the following deficits and impairments:  Postural dysfunction,Decreased knowledge of precautions,Impaired UE functional use,Impaired flexibility,Increased fascial restricitons,Decreased strength,Decreased range of motion  Visit Diagnosis: Stiffness of right shoulder, not elsewhere classified  Stiffness of left shoulder, not elsewhere classified  Aftercare following surgery for neoplasm  Abnormal posture  Malignant neoplasm of upper-outer quadrant of right  breast in female, estrogen receptor positive ([place])     Problem List Patient Active Problem List   Diagnosis Date Noted  . Breast cancer, right ([place]) 10/12/2020  . History of cervical dysplasia 10/06/2020  . Malignant neoplasm of lower-inner quadrant of right breast of female, estrogen receptor positive ([place]) 09/16/2020  . 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 Family history of BRCA1 gene positive   . Family history of breast cancer   . Family history of prostate cancer   . Family history of colon cancer     Otelia Limes, PTA 11/24/2020, 10:04 AM  [place], [place], [address] Phone: [phone]   Fax:  [phone]  Name: Pamela [name] MRN: [id number] Date of Birth: 05-07-1976

## 2020-11-24 NOTE — Patient Instructions (Signed)

## 2020-11-28 ENCOUNTER — Other Ambulatory Visit: Payer: Self-pay

## 2020-11-28 ENCOUNTER — Ambulatory Visit: Payer: BC Managed Care – PPO | Attending: General Surgery

## 2020-11-28 DIAGNOSIS — C50411 Malignant neoplasm of upper-outer quadrant of right female breast: Secondary | ICD-10-CM | POA: Diagnosis present

## 2020-11-28 DIAGNOSIS — Z483 Aftercare following surgery for neoplasm: Secondary | ICD-10-CM | POA: Diagnosis present

## 2020-11-28 DIAGNOSIS — M25611 Stiffness of right shoulder, not elsewhere classified: Secondary | ICD-10-CM | POA: Insufficient documentation

## 2020-11-28 DIAGNOSIS — R293 Abnormal posture: Secondary | ICD-10-CM | POA: Diagnosis present

## 2020-11-28 DIAGNOSIS — Z170 Estrogen receptor positive status [ER+]: Secondary | ICD-10-CM | POA: Insufficient documentation

## 2020-11-28 DIAGNOSIS — M25612 Stiffness of left shoulder, not elsewhere classified: Secondary | ICD-10-CM | POA: Insufficient documentation

## 2020-11-28 NOTE — Therapy (Signed)
[place], [place], [address] Phone: [phone]   Fax:  [phone]  Physical Therapy Treatment  Patient Details  Name: Pamela [name] MRN: [id number] Date of Birth: 02-02-1976 Referring Provider (PT): Dr. Donne Hazel   Encounter Date: 11/28/2020   PT End of Session - 11/28/20 1005    Visit Number 7    Number of Visits 10    Date for PT Re-Evaluation 12/01/20    PT Start Time 0906    PT Stop Time 1002    PT Time Calculation (min) 56 min    Activity Tolerance Patient tolerated treatment well    Behavior During Therapy [facility] for tasks assessed/performed           Past Medical History:  Diagnosis Date  . 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 Family history of BRCA1 gene positive   . Family history of breast cancer   . Family history of colon cancer   . Family history of prostate cancer     Past Surgical History:  Procedure Laterality Date  . BREAST RECONSTRUCTION WITH PLACEMENT OF TISSUE EXPANDER AND ALLODERM Bilateral 10/12/2020   Procedure: BILATERAL BREAST RECONSTRUCTION WITH PLACEMENT OF TISSUE EXPANDER AND ALLODERM;  Surgeon: Irene Limbo, MD;  Location: [place];  Service: Plastics;  Laterality: Bilateral;  . CERVICAL DISC SURGERY     ruptured disc neck with prosthetic replacement  . GANGLION CYST EXCISION Left    left wrist  . NIPPLE SPARING MASTECTOMY WITH SENTINEL LYMPH NODE BIOPSY Bilateral 10/12/2020   Procedure: BILATERAL NIPPLE SPARING MASTECTOMY WITH RIGHT AXILLARY SENTINEL LYMPH NODE BIOPSY;  Surgeon: Rolm Bookbinder, MD;  Location: [place];  Service: General;  Laterality: Bilateral;  [place], RNFA  . TUBAL LIGATION      There were no vitals filed for this visit.   Subjective Assessment - 11/28/20 0909    Subjective I did the theraband exercises yesterday that you gave me. I can see an improvement since I started. I'm reaching higher  and more aware of keeping my shoulders down and try to relax them. I don't find myself hesitating before I reach with my arm anymore either, I'm not babying it like I was before.    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 No/denies                             [facility] Adult PT Treatment/Exercise - 11/28/20 0001      Shoulder Exercises: Pulleys   Flexion 2 minutes    Flexion Limitations VCs to relax    ABduction 2 minutes    ABduction Limitations VCs to relax shoulders      Shoulder Exercises: Therapy Ball   Flexion Both;10 reps   forward lean into end stretch   ABduction Right;Left;10 reps   same side leans into end motions     Manual Therapy   Manual Therapy Myofascial release;Passive ROM;Soft tissue mobilization;Scapular mobilization    Soft tissue mobilization In Lt S/L to Rt medial scapular border for multiple trigger points, also to Rt UT and lateral trunk    Myofascial Release To Rt>Lt axillae during P/ROM  Scapular Mobilization In Lt S/L to Rt scapula into protraction and retraction; scapular depression during P/ROM help decrease "pinch" at top of shoulder pt was feeling    Passive ROM In Supine to Rt>Lt shoulders into flexion and abduction to pts tolerance, P/ROM for Rt UE much improved after scapular mobs                       PT Long Term Goals - 11/03/20 1539      PT LONG TERM GOAL #1   Title Pt will return to baseline ROM measurements and not demonstrate any signs/symptoms of lymphedema.    Time 8    Period Weeks    Status On-going      PT LONG TERM GOAL #2   Title Pt will demonstrate 170 degrees of bilateral shoulder flexion to allow pt to reach overhead.    Baseline R 101, L 75    Time 4     Period Weeks    Status New    Target Date 12/01/20      PT LONG TERM GOAL #3   Title Pt will demonstrate 170 degrees of bilateral shoulder abduction to allow her to reach out to the side    Baseline R 75, L 81    Time 4    Period Weeks    Status New    Target Date 12/01/20      PT LONG TERM GOAL #4   Title Pt will be independent in a home exercise program for continued strengthening and stretching.    Time 4    Period Weeks    Status New    Target Date 12/01/20                 Plan - 11/28/20 1225    Clinical Impression Statement Pt reports tolerated supine scapular series at home yseterday well and notes she is slowly being able to reach higher at home and finds herself protecting her arm much less than she was before physical therapy. Started session with AA/ROM with pulleys and ball roll up wall. She was able to return good demo with only minor VCs initally to decrease Rt scapular compensations/relax shoulders. Then continued with manual therapy focusing most of time on Rt upper quadrant. She was very guarded initia;y and struggled with relaxing preventing end motion stretching so focused on Rt scapular mobs and STM for trigger point release along medial Rt scapula, also palpably very tight muscles. Educated pt to try having either husband roll tennis ball here or she can place one in a long sock to throw over her shoulder and self mob against wall. This all softened very well along with improved scapular mobility. Then once pt back in supine, had much imporved end P/ROM and she was better able to relax. P/ROM performed to Lt side but as time allowed at end of session as her Rt side is more affected. Pt reports feeling great relief of tightness at Rt upper quadrant at end of session. she is supposed to have first day back to work tomorrow.    Stability/Clinical Decision Making Stable/Uncomplicated    Rehab Potential Good    PT Frequency 2x / week    PT Duration 4 weeks    PT  Treatment/Interventions ADLs/Self Care Home Management;Patient/family education;Therapeutic exercise;Manual techniques;Passive range of motion    PT Next Visit Plan Contt AA/ROM stretching; how was first day back at work? Cont manual therapy working to improve Rt>Lt shoulder  ROM including STM to Rt medial scapular border and scap mobs as done today; progress HEP prn    PT Home Exercise Plan post op shoulder ROM; supine over towel roll for bil UE's and scapular mobility; supine scapular series with yellow theraband    Consulted and Agree with Plan of Care Patient           Patient will benefit from skilled therapeutic intervention in order to improve the following deficits and impairments:  Postural dysfunction,Decreased knowledge of precautions,Impaired UE functional use,Impaired flexibility,Increased fascial restricitons,Decreased strength,Decreased range of motion  Visit Diagnosis: Stiffness of right shoulder, not elsewhere classified  Stiffness of left shoulder, not elsewhere classified  Aftercare following surgery for neoplasm  Abnormal posture  Malignant neoplasm of upper-outer quadrant of right breast in female, estrogen receptor positive ([place])     Problem List Patient Active Problem List   Diagnosis Date Noted  . Breast cancer, right ([place]) 10/12/2020  . History of cervical dysplasia 10/06/2020  . Malignant neoplasm of lower-inner quadrant of right breast of female, estrogen receptor positive ([place]) 09/16/2020  . 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 Family history of BRCA1 gene positive   . Family history of breast cancer   . Family history of prostate cancer   . Family history of colon cancer     Otelia Limes, PTA 11/28/2020, 12:33 PM  [place], [place], [address] Phone: [phone]   Fax:  [phone]  Name: Pamela [name] MRN: [id number] Date of Birth: September 16, 1975

## 2020-11-30 ENCOUNTER — Encounter: Payer: Self-pay | Admitting: Physical Therapy

## 2020-11-30 ENCOUNTER — Other Ambulatory Visit: Payer: Self-pay

## 2020-11-30 ENCOUNTER — Ambulatory Visit: Payer: BC Managed Care – PPO | Admitting: Physical Therapy

## 2020-11-30 DIAGNOSIS — M25611 Stiffness of right shoulder, not elsewhere classified: Secondary | ICD-10-CM | POA: Diagnosis not present

## 2020-11-30 DIAGNOSIS — M25612 Stiffness of left shoulder, not elsewhere classified: Secondary | ICD-10-CM

## 2020-11-30 DIAGNOSIS — Z483 Aftercare following surgery for neoplasm: Secondary | ICD-10-CM

## 2020-11-30 DIAGNOSIS — C50411 Malignant neoplasm of upper-outer quadrant of right female breast: Secondary | ICD-10-CM

## 2020-11-30 DIAGNOSIS — Z170 Estrogen receptor positive status [ER+]: Secondary | ICD-10-CM

## 2020-11-30 DIAGNOSIS — R293 Abnormal posture: Secondary | ICD-10-CM

## 2020-11-30 NOTE — Therapy (Signed)
[place], [place], [address] Phone: [phone]   Fax:  [phone]  Physical Therapy Treatment  Patient Details  Name: Pamela [name] MRN: [id number] Date of Birth: Aug 13, 1976 Referring Provider (PT): Dr. Donne Hazel   Encounter Date: 11/30/2020   PT End of Session - 11/30/20 0958    Visit Number 8    Number of Visits 10    Date for PT Re-Evaluation 12/01/20    PT Start Time 0913   pt arrived late   PT Stop Time 0958    PT Time Calculation (min) 45 min    Activity Tolerance Patient tolerated treatment well    Behavior During Therapy [facility] for tasks assessed/performed           Past Medical History:  Diagnosis Date  . 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 Family history of BRCA1 gene positive   . Family history of breast cancer   . Family history of colon cancer   . Family history of prostate cancer     Past Surgical History:  Procedure Laterality Date  . BREAST RECONSTRUCTION WITH PLACEMENT OF TISSUE EXPANDER AND ALLODERM Bilateral 10/12/2020   Procedure: BILATERAL BREAST RECONSTRUCTION WITH PLACEMENT OF TISSUE EXPANDER AND ALLODERM;  Surgeon: Irene Limbo, MD;  Location: [place];  Service: Plastics;  Laterality: Bilateral;  . CERVICAL DISC SURGERY     ruptured disc neck with prosthetic replacement  . GANGLION CYST EXCISION Left    left wrist  . NIPPLE SPARING MASTECTOMY WITH SENTINEL LYMPH NODE BIOPSY Bilateral 10/12/2020   Procedure: BILATERAL NIPPLE SPARING MASTECTOMY WITH RIGHT AXILLARY SENTINEL LYMPH NODE BIOPSY;  Surgeon: Rolm Bookbinder, MD;  Location: [place];  Service: General;  Laterality: Bilateral;  [place], RNFA  . TUBAL LIGATION      There were no vitals filed for this visit.   Subjective Assessment - 11/30/20 0914    Subjective Everything is getting better. Yesterday was my first day back at work. I did not feel any discomfort  just tired. I worked 8 hours. I am still having some tightness on the R side.    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 No/denies    Pain Score 0-No pain                             [facility] Adult PT Treatment/Exercise - 11/30/20 0001      Shoulder Exercises: Pulleys   Flexion 2 minutes    ABduction 2 minutes    ABduction Limitations v/c to not go in to pain and retract shoulders to decrease pain      Shoulder Exercises: Therapy Ball   Flexion Both;10 reps   forward lean into end stretch   ABduction Right;Left;10 reps   same side leans into end motions     Manual Therapy   Soft tissue mobilization In Lt S/L to Rt medial scapular border for multiple trigger points, also to Rt UT and lateral trunk    Myofascial Release To Rt>Lt axillae during P/ROM    Scapular Mobilization In Lt S/L to Rt scapula into protraction and retraction; scapular depression during P/ROM help decrease "  pinch" at top of shoulder pt was feeling    Passive ROM In Supine to Rt>Lt shoulders into flexion and abduction to pts tolerance, P/ROM for Rt UE much improved after scapular mobs                       PT Long Term Goals - 11/03/20 1539      PT LONG TERM GOAL #1   Title Pt will return to baseline ROM measurements and not demonstrate any signs/symptoms of lymphedema.    Time 8    Period Weeks    Status On-going      PT LONG TERM GOAL #2   Title Pt will demonstrate 170 degrees of bilateral shoulder flexion to allow pt to reach overhead.    Baseline R 101, L 75    Time 4    Period Weeks    Status New    Target Date 12/01/20      PT LONG TERM GOAL #3   Title Pt will demonstrate 170 degrees of bilateral shoulder abduction to allow her  to reach out to the side    Baseline R 75, L 81    Time 4    Period Weeks    Status New    Target Date 12/01/20      PT LONG TERM GOAL #4   Title Pt will be independent in a home exercise program for continued strengthening and stretching.    Time 4    Period Weeks    Status New    Target Date 12/01/20                 Plan - 11/30/20 1001    Clinical Impression Statement Pt feels manual therapy is helping to improve the pinching sensation at end range of motion in her R shoulder. She still had numerous trigger points along medial border of the scapula that improved with manual therapy. Educated pt to place tennis ball in long sock and throw it over her shoulder and lean against wall to do self massage to this area. Pt reports she is slowly feeling more like herself and has just returned to work. Pt would benefit from continued skilled PT services to continue to decrease tightness in bilateral shoulders to improve ROM and decrease trigger points.    PT Frequency 2x / week    PT Duration 4 weeks    PT Treatment/Interventions ADLs/Self Care Home Management;Patient/family education;Therapeutic exercise;Manual techniques;Passive range of motion    PT Next Visit Plan assess goals and recert - Contt AA/ROM stretching; how was first day back at work? Cont manual therapy working to improve Rt>Lt shoulder ROM including STM to Rt medial scapular border and scap mobs as done today; progress HEP prn    PT Home Exercise Plan post op shoulder ROM; supine over towel roll for bil UE's and scapular mobility; supine scapular series with yellow theraband    Consulted and Agree with Plan of Care Patient           Patient will benefit from skilled therapeutic intervention in order to improve the following deficits and impairments:  Postural dysfunction,Decreased knowledge of precautions,Impaired UE functional use,Impaired flexibility,Increased fascial restricitons,Decreased strength,Decreased range of  motion  Visit Diagnosis: Stiffness of right shoulder, not elsewhere classified  Stiffness of left shoulder, not elsewhere classified  Aftercare following surgery for neoplasm  Abnormal posture  Malignant neoplasm of upper-outer quadrant of right breast in female, estrogen receptor positive ([place])  Problem List Patient Active Problem List   Diagnosis Date Noted  . Breast cancer, right ([place]) 10/12/2020  . History of cervical dysplasia 10/06/2020  . Malignant neoplasm of lower-inner quadrant of right breast of female, estrogen receptor positive ([place]) 09/16/2020  . 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 Family history of BRCA1 gene positive   . Family history of breast cancer   . Family history of prostate cancer   . Family history of colon cancer     Allyson Sabal [facility] 11/30/2020, 10:03 AM  [place], [place], [address] Phone: [phone]   Fax:  [phone]  Name: KEYANAH KOZICKI MRN: [id number] Date of Birth: [birth year]  Manus Gunning, PT 11/30/20 10:04 AM

## 2020-12-05 ENCOUNTER — Ambulatory Visit: Payer: BC Managed Care – PPO | Admitting: Physical Therapy

## 2020-12-05 ENCOUNTER — Encounter: Payer: Self-pay | Admitting: Physical Therapy

## 2020-12-05 ENCOUNTER — Other Ambulatory Visit: Payer: Self-pay

## 2020-12-05 DIAGNOSIS — M25611 Stiffness of right shoulder, not elsewhere classified: Secondary | ICD-10-CM | POA: Diagnosis not present

## 2020-12-05 DIAGNOSIS — Z483 Aftercare following surgery for neoplasm: Secondary | ICD-10-CM

## 2020-12-05 DIAGNOSIS — M25612 Stiffness of left shoulder, not elsewhere classified: Secondary | ICD-10-CM

## 2020-12-05 DIAGNOSIS — R293 Abnormal posture: Secondary | ICD-10-CM

## 2020-12-05 DIAGNOSIS — C50411 Malignant neoplasm of upper-outer quadrant of right female breast: Secondary | ICD-10-CM

## 2020-12-05 NOTE — Therapy (Signed)
[place], [place], [address] Phone: [phone]   Fax:  [phone]  Physical Therapy Treatment  Patient Details  Name: Pamela [name] MRN: [id number] Date of Birth: 1976-03-16 Referring Provider (PT): Dr. Donne Hazel   Encounter Date: 12/05/2020   PT End of Session - 12/05/20 1057    Visit Number 9    Number of Visits 17    Date for PT Re-Evaluation 01/02/21    PT Start Time 1006    PT Stop Time 1055    PT Time Calculation (min) 49 min    Activity Tolerance Patient tolerated treatment well    Behavior During Therapy [facility] for tasks assessed/performed           Past Medical History:  Diagnosis Date  . 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 Family history of BRCA1 gene positive   . Family history of breast cancer   . Family history of colon cancer   . Family history of prostate cancer     Past Surgical History:  Procedure Laterality Date  . BREAST RECONSTRUCTION WITH PLACEMENT OF TISSUE EXPANDER AND ALLODERM Bilateral 10/12/2020   Procedure: BILATERAL BREAST RECONSTRUCTION WITH PLACEMENT OF TISSUE EXPANDER AND ALLODERM;  Surgeon: Irene Limbo, MD;  Location: [facility];  Service: Plastics;  Laterality: Bilateral;  . CERVICAL DISC SURGERY     ruptured disc neck with prosthetic replacement  . GANGLION CYST EXCISION Left    left wrist  . NIPPLE SPARING MASTECTOMY WITH SENTINEL LYMPH NODE BIOPSY Bilateral 10/12/2020   Procedure: BILATERAL NIPPLE SPARING MASTECTOMY WITH RIGHT AXILLARY SENTINEL LYMPH NODE BIOPSY;  Surgeon: Rolm Bookbinder, MD;  Location: [place];  Service: General;  Laterality: Bilateral;  [place], RNFA  . TUBAL LIGATION      There were no vitals filed for this visit.   Subjective Assessment - 12/05/20 1007    Subjective I am doing pretty good today. I still have some tightness on the right side. I am progressing.    Pertinent History  10/12/20 bilateral nipple sparing mastectomy with R SLNB (4 nodes all negative) for treatment of R breast cancer that is ER/PR+,HER2-, BRCA 1 positive, plan is to have total hysterectomy on 02/22/21    Patient Stated Goals to gain info from provider    Currently in Pain? No/denies    Pain Score 0-No pain              [facility] PT Assessment - 12/05/20 0001      AROM   Right Shoulder Flexion 150 Degrees    Right Shoulder ABduction 122 Degrees    Left Shoulder Flexion 152 Degrees    Left Shoulder ABduction 111 Degrees                         [facility] Adult PT Treatment/Exercise - 12/05/20 0001      Manual Therapy   Myofascial Release To Rt>Lt axillae during P/ROM    Scapular Mobilization In Lt S/L to Rt scapula into protraction and retraction; scapular depression during P/ROM help decrease "pinch" at top of shoulder pt was feeling    Passive ROM In Supine to Rt>Lt shoulders into flexion and abduction to pts tolerance, then in L sidelying in to flexion and abduction with no pinching noted in this position  PT Long Term Goals - 12/05/20 1008      PT LONG TERM GOAL #1   Title Pt will return to baseline ROM measurements and not demonstrate any signs/symptoms of lymphedema.    Time 8    Period Weeks    Status On-going      PT LONG TERM GOAL #2   Title Pt will demonstrate 170 degrees of bilateral shoulder flexion to allow pt to reach overhead.    Baseline R 101, L 75; 12/05/20- R 150, L 152    Time 4    Period Weeks    Status On-going      PT LONG TERM GOAL #3   Title Pt will demonstrate 170 degrees of bilateral shoulder abduction to allow her to reach out to the side    Baseline R 75, L 81; 12/05/20- R 122, L 111    Time 4    Period Weeks    Status On-going      PT LONG TERM GOAL #4   Title Pt will be independent in a home exercise program for continued strengthening and stretching.    Time 4    Period Weeks    Status On-going                  Plan - 12/05/20 1058    Clinical Impression Statement Assessed pt's progress towards goals in therapy. Pt is progressing towards goals and her bilateral shoulder ROM has improved greatly from time of evaluation but is still limited. She has a lot of pinching discomfort in her R shoulder with abduction and flexion most likely due to impingement. Pt would benefit from continued skilled PT services to continue to improve bilateral shoulder ROM and scapular strength to help decrease discomfort as well as instruct pt in a home exercise program. Continued today with PROM and scapular mobilization to R scapula to help improve R shoulder ROM.    PT Frequency 2x / week    PT Duration 4 weeks    PT Treatment/Interventions ADLs/Self Care Home Management;Patient/family education;Therapeutic exercise;Manual techniques;Passive range of motion    PT Next Visit Plan Contt AA/ROM stretching; how was first day back at work? Cont manual therapy working to improve Rt>Lt shoulder ROM including STM to Rt medial scapular border and scap mobs as done today; progress HEP prn    PT Home Exercise Plan post op shoulder ROM; supine over towel roll for bil UE's and scapular mobility; supine scapular series with yellow theraband    Consulted and Agree with Plan of Care Patient           Patient will benefit from skilled therapeutic intervention in order to improve the following deficits and impairments:  Postural dysfunction,Decreased knowledge of precautions,Impaired UE functional use,Impaired flexibility,Increased fascial restricitons,Decreased strength,Decreased range of motion  Visit Diagnosis: Stiffness of right shoulder, not elsewhere classified  Stiffness of left shoulder, not elsewhere classified  Aftercare following surgery for neoplasm  Abnormal posture  Malignant neoplasm of upper-outer quadrant of right breast in female, estrogen receptor positive ([place])     Problem List Patient  Active Problem List   Diagnosis Date Noted  . Breast cancer, right ([place]) 10/12/2020  . History of cervical dysplasia 10/06/2020  . Malignant neoplasm of lower-inner quadrant of right breast of female, estrogen receptor positive ([place]) 09/16/2020  . 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 Family history of BRCA1 gene positive   . Family history of breast cancer   . Family history  of prostate cancer   . Family history of colon cancer     Allyson Sabal [facility] 12/05/2020, [place], [place], [address] Phone: [phone]   Fax:  [phone]  Name: Pamela [name] MRN: [id number] Date of Birth: 1975-12-17  Manus Gunning, PT 12/05/20 11:01 AM

## 2020-12-07 ENCOUNTER — Ambulatory Visit: Payer: BC Managed Care – PPO | Admitting: Physical Therapy

## 2020-12-07 ENCOUNTER — Encounter: Payer: Self-pay | Admitting: Physical Therapy

## 2020-12-07 ENCOUNTER — Other Ambulatory Visit: Payer: Self-pay

## 2020-12-07 DIAGNOSIS — M25611 Stiffness of right shoulder, not elsewhere classified: Secondary | ICD-10-CM | POA: Diagnosis not present

## 2020-12-07 DIAGNOSIS — Z170 Estrogen receptor positive status [ER+]: Secondary | ICD-10-CM

## 2020-12-07 DIAGNOSIS — R293 Abnormal posture: Secondary | ICD-10-CM

## 2020-12-07 DIAGNOSIS — Z483 Aftercare following surgery for neoplasm: Secondary | ICD-10-CM

## 2020-12-07 DIAGNOSIS — M25612 Stiffness of left shoulder, not elsewhere classified: Secondary | ICD-10-CM

## 2020-12-07 DIAGNOSIS — C50411 Malignant neoplasm of upper-outer quadrant of right female breast: Secondary | ICD-10-CM

## 2020-12-07 NOTE — Therapy (Signed)
[place], [place], [address] Phone: [phone]   Fax:  [phone]  Physical Therapy Treatment  Patient Details  Name: Pamela [name] MRN: [id number] Date of Birth: 1975-11-07 Referring Provider (PT): Dr. Donne Hazel   Encounter Date: 12/07/2020   PT End of Session - 12/07/20 1101    Visit Number 10    Number of Visits 17    Date for PT Re-Evaluation 01/02/21    PT Start Time 1007    PT Stop Time 1054    PT Time Calculation (min) 47 min    Activity Tolerance Patient tolerated treatment well    Behavior During Therapy [facility] for tasks assessed/performed           Past Medical History:  Diagnosis Date  . 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 Family history of BRCA1 gene positive   . Family history of breast cancer   . Family history of colon cancer   . Family history of prostate cancer     Past Surgical History:  Procedure Laterality Date  . BREAST RECONSTRUCTION WITH PLACEMENT OF TISSUE EXPANDER AND ALLODERM Bilateral 10/12/2020   Procedure: BILATERAL BREAST RECONSTRUCTION WITH PLACEMENT OF TISSUE EXPANDER AND ALLODERM;  Surgeon: Irene Limbo, MD;  Location: [place];  Service: Plastics;  Laterality: Bilateral;  . CERVICAL DISC SURGERY     ruptured disc neck with prosthetic replacement  . GANGLION CYST EXCISION Left    left wrist  . NIPPLE SPARING MASTECTOMY WITH SENTINEL LYMPH NODE BIOPSY Bilateral 10/12/2020   Procedure: BILATERAL NIPPLE SPARING MASTECTOMY WITH RIGHT AXILLARY SENTINEL LYMPH NODE BIOPSY;  Surgeon: Rolm Bookbinder, MD;  Location: [place];  Service: General;  Laterality: Bilateral;  [place], RNFA  . TUBAL LIGATION      There were no vitals filed for this visit.   Subjective Assessment - 12/07/20 1007    Subjective My tightness is getting better. I have been pushing both sides more.    Pertinent History 10/12/20 bilateral nipple  sparing mastectomy with R SLNB (4 nodes all negative) for treatment of R breast cancer that is ER/PR+,HER2-, BRCA 1 positive, plan is to have total hysterectomy on 02/22/21    Patient Stated Goals to gain info from provider    Currently in Pain? No/denies    Pain Score 0-No pain                             [facility] Adult PT Treatment/Exercise - 12/07/20 0001      Shoulder Exercises: Supine   Horizontal ABduction Strengthening;Both;Theraband;5 reps    Theraband Level (Shoulder Horizontal ABduction) Level 2 (Red)    External Rotation Strengthening;Both;Theraband;5 reps   v/c for ebows at 90 degrees   Theraband Level (Shoulder External Rotation) Level 2 (Red)    Flexion Strengthening;Both;Theraband;5 reps   narrow and wide grip   Theraband Level (Shoulder Flexion) Level 2 (Red)    Diagonals Strengthening;Right;Left;Theraband;5 reps    Theraband Level (Shoulder Diagonals) Level 2 (Red)      Shoulder Exercises: Pulleys   Flexion 2 minutes    ABduction 2 minutes   v/c for pure abduction     Shoulder Exercises: Therapy Ball   Flexion Both;10 reps    ABduction Right;Left;10 reps      Manual Therapy   Soft tissue mobilization In Lt S/L to Rt medial scapular border for multiple trigger points  Myofascial Release to bilateral axilla    Scapular Mobilization In Lt S/L to Rt scapula into protraction and retraction    Passive ROM In Supine to bilateral shoulders into flexion and abduction to pts tolerance, then in L sidelying during scap mobs to R shoulder in to flexion with no pinching noted in this position                       PT Long Term Goals - 12/05/20 1008      PT LONG TERM GOAL #1   Title Pt will return to baseline ROM measurements and not demonstrate any signs/symptoms of lymphedema.    Time 8    Period Weeks    Status On-going      PT LONG TERM GOAL #2   Title Pt will demonstrate 170 degrees of bilateral shoulder flexion to allow pt to reach  overhead.    Baseline R 101, L 75; 12/05/20- R 150, L 152    Time 4    Period Weeks    Status On-going      PT LONG TERM GOAL #3   Title Pt will demonstrate 170 degrees of bilateral shoulder abduction to allow her to reach out to the side    Baseline R 75, L 81; 12/05/20- R 122, L 111    Time 4    Period Weeks    Status On-going      PT LONG TERM GOAL #4   Title Pt will be independent in a home exercise program for continued strengthening and stretching.    Time 4    Period Weeks    Status On-going                 Plan - 12/07/20 1212    Clinical Impression Statement Continued with AAROM and PROM to bilateral shoulders to improve end range of motion. Pt still having pinching pain in R shoulder near end range but it is improving and pt is able to move further in to ROM prior to the pain beginning. Issued red band to pt to use during supine scap exercises. Pt has signficant tightness across L pec with numerous tight rope like bands palpable when L shoulder is near end range flexion which limit motion. Focused on manual therapy to this today to improve ROM.    PT Frequency 2x / week    PT Duration 4 weeks    PT Treatment/Interventions ADLs/Self Care Home Management;Patient/family education;Therapeutic exercise;Manual techniques;Passive range of motion    PT Next Visit Plan Contt AA/ROM stretching; how was first day back at work? Cont manual therapy working to improve Rt>Lt shoulder ROM including STM to Rt medial scapular border and scap mobs as done today; progress HEP prn    PT Home Exercise Plan post op shoulder ROM; supine over towel roll for bil UE's and scapular mobility; supine scapular series with yellow theraband    Consulted and Agree with Plan of Care Patient           Patient will benefit from skilled therapeutic intervention in order to improve the following deficits and impairments:  Postural dysfunction,Decreased knowledge of precautions,Impaired UE functional  use,Impaired flexibility,Increased fascial restricitons,Decreased strength,Decreased range of motion  Visit Diagnosis: Stiffness of right shoulder, not elsewhere classified  Stiffness of left shoulder, not elsewhere classified  Aftercare following surgery for neoplasm  Abnormal posture  Malignant neoplasm of upper-outer quadrant of right breast in female, estrogen receptor positive ([place])  Problem List Patient Active Problem List   Diagnosis Date Noted  . Breast cancer, right ([place]) 10/12/2020  . History of cervical dysplasia 10/06/2020  . Malignant neoplasm of lower-inner quadrant of right breast of female, estrogen receptor positive ([place]) 09/16/2020  . 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 Family history of BRCA1 gene positive   . Family history of breast cancer   . Family history of prostate cancer   . Family history of colon cancer     [facility] 12/07/2020, 12:15 PM  [place], [place], [address] Phone: [phone]   Fax:  [phone]  Name: Pamela [name] MRN: [id number] Date of Birth: 1976/05/02  Manus Gunning, PT 12/07/20 12:15 PM

## 2020-12-19 ENCOUNTER — Ambulatory Visit: Payer: BC Managed Care – PPO

## 2020-12-21 ENCOUNTER — Ambulatory Visit: Payer: BC Managed Care – PPO

## 2020-12-21 ENCOUNTER — Other Ambulatory Visit: Payer: Self-pay

## 2020-12-21 DIAGNOSIS — R293 Abnormal posture: Secondary | ICD-10-CM

## 2020-12-21 DIAGNOSIS — Z170 Estrogen receptor positive status [ER+]: Secondary | ICD-10-CM

## 2020-12-21 DIAGNOSIS — M25612 Stiffness of left shoulder, not elsewhere classified: Secondary | ICD-10-CM

## 2020-12-21 DIAGNOSIS — Z483 Aftercare following surgery for neoplasm: Secondary | ICD-10-CM

## 2020-12-21 DIAGNOSIS — M25611 Stiffness of right shoulder, not elsewhere classified: Secondary | ICD-10-CM

## 2020-12-21 NOTE — Patient Instructions (Signed)

## 2020-12-21 NOTE — Therapy (Signed)
[place], [place], [address] Phone: [phone]   Fax:  [phone]  Physical Therapy Treatment  Patient Details  Name: Pamela [name] MRN: [id number] Date of Birth: [birth year] Referring Provider (PT): Dr. Donne Hazel   Encounter Date: 12/21/2020   PT End of Session - 12/21/20 1000    Visit Number 11    Number of Visits 17    Date for PT Re-Evaluation 01/02/21    PT Start Time 0901    PT Stop Time 1001    PT Time Calculation (min) 60 min    Activity Tolerance Patient tolerated treatment well    Behavior During Therapy [facility] for tasks assessed/performed           Past Medical History:  Diagnosis Date  . 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 Family history of BRCA1 gene positive   . Family history of breast cancer   . Family history of colon cancer   . Family history of prostate cancer     Past Surgical History:  Procedure Laterality Date  . BREAST RECONSTRUCTION WITH PLACEMENT OF TISSUE EXPANDER AND ALLODERM Bilateral 10/12/2020   Procedure: BILATERAL BREAST RECONSTRUCTION WITH PLACEMENT OF TISSUE EXPANDER AND ALLODERM;  Surgeon: Irene Limbo, MD;  Location: [place];  Service: Plastics;  Laterality: Bilateral;  . CERVICAL DISC SURGERY     ruptured disc neck with prosthetic replacement  . GANGLION CYST EXCISION Left    left wrist  . NIPPLE SPARING MASTECTOMY WITH SENTINEL LYMPH NODE BIOPSY Bilateral 10/12/2020   Procedure: BILATERAL NIPPLE SPARING MASTECTOMY WITH RIGHT AXILLARY SENTINEL LYMPH NODE BIOPSY;  Surgeon: Rolm Bookbinder, MD;  Location: [place];  Service: General;  Laterality: Bilateral;  [place], RNFA  . TUBAL LIGATION      There were no vitals filed for this visit.   Subjective Assessment - 12/21/20 0902    Subjective I'm tired today just from getting back into my routine with work. I had some pain last week at my each side of my  trunk but I had started doing my yard work and after a day or so it settled back down so I know it was just from doing more. I scaled it back and took a day off from work then I felt better.    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 No/denies                             [facility] Adult PT Treatment/Exercise - 12/21/20 0001      Shoulder Exercises: Standing   Other Standing Exercises Bil UE 3 way raises with 2 lbs into flexion, scaption and abduction to shoulder height only x10 each returning therapist demo and core engaged, shoulders and head against wall      Shoulder Exercises: Pulleys   Flexion 2 minutes    ABduction 2 minutes    ABduction Limitations VCs to relax shoulders      Shoulder Exercises: Therapy Ball   Flexion Both;10 reps   forward lean into end of stretch; 1 lb added to wrists   ABduction Right;Left;10 reps   same side lean into end  of stretch; 1 lb added to each wrist     Manual Therapy   Soft tissue mobilization In Lt S/L to Rt medial scapular border for multiple trigger points with cocoa butter    Myofascial Release to bilateral axilla    Scapular Mobilization In Lt S/L to Rt scapula into protraction and retraction    Passive ROM In Supine to bilateral shoulders into flexion abduction, and D2 to pts tolerance                  PT Education - 12/21/20 0915    Education Details Standing bil UE 3 way raises    Person(s) Educated Patient    Methods Explanation;Demonstration;Handout    Comprehension Verbalized understanding;Returned demonstration               PT Long Term Goals - 12/05/20 1008      PT LONG TERM GOAL #1   Title Pt will return to baseline ROM measurements and not demonstrate  any signs/symptoms of lymphedema.    Time 8    Period Weeks    Status On-going      PT LONG TERM GOAL #2   Title Pt will demonstrate 170 degrees of bilateral shoulder flexion to allow pt to reach overhead.    Baseline R 101, L 75; 12/05/20- R 150, L 152    Time 4    Period Weeks    Status On-going      PT LONG TERM GOAL #3   Title Pt will demonstrate 170 degrees of bilateral shoulder abduction to allow her to reach out to the side    Baseline R 75, L 81; 12/05/20- R 122, L 111    Time 4    Period Weeks    Status On-going      PT LONG TERM GOAL #4   Title Pt will be independent in a home exercise program for continued strengthening and stretching.    Time 4    Period Weeks    Status On-going                 Plan - 12/21/20 1002    Clinical Impression Statement Continued AA/ROM and progressed pt to include weights on wrist for ball roll up wall and added bil UE 3 way raises. Pt did well with these. Then continued manual therapy working to promote increased end bil shoulder P/ROM. Pt is still limited Rt>Lt end ROM by fascial restrictions but this has improved since this therapist saw her last.    Stability/Clinical Decision Making Stable/Uncomplicated    Rehab Potential Good    PT Frequency 2x / week    PT Duration 4 weeks    PT Treatment/Interventions ADLs/Self Care Home Management;Patient/family education;Therapeutic exercise;Manual techniques;Passive range of motion    PT Next Visit Plan Contt AA/ROM stretching; Cont manual therapy working to improve Rt>Lt shoulder ROM including STM to Rt medial scapular border and scap mobs; progress HEP and review bil UE 3 way raises issued today    PT Home Exercise Plan post op shoulder ROM; supine over towel roll for bil UE's and scapular mobility; supine scapular series with yellow theraband; bil UE 3 way raises    Consulted and Agree with Plan of Care Patient           Patient will benefit from skilled therapeutic intervention in  order to improve the following deficits and impairments:  Postural dysfunction,Decreased knowledge of precautions,Impaired UE functional use,Impaired flexibility,Increased fascial restricitons,Decreased strength,Decreased range of  motion  Visit Diagnosis: Stiffness of right shoulder, not elsewhere classified  Stiffness of left shoulder, not elsewhere classified  Aftercare following surgery for neoplasm  Abnormal posture  Malignant neoplasm of upper-outer quadrant of right breast in female, estrogen receptor positive ([place])     Problem List Patient Active Problem List   Diagnosis Date Noted  . Breast cancer, right ([place]) 10/12/2020  . History of cervical dysplasia 10/06/2020  . Malignant neoplasm of lower-inner quadrant of right breast of female, estrogen receptor positive ([place]) 09/16/2020  . 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 Family history of BRCA1 gene positive   . Family history of breast cancer   . Family history of prostate cancer   . Family history of colon cancer     Otelia Limes, PTA 12/21/2020, 11:00 AM  [place], [place], [address] Phone: [phone]   Fax:  [phone]  Name: ELLIANNAH WAYMENT MRN: [id number] Date of Birth: March 25, 1976

## 2020-12-22 NOTE — H&P (Signed)
Subjective:     Patient ID: Pamela [name] is a 45 y.o. female.  HPI  10 weeks post op bilateral mastectomies with immediate expander based reconstruction. Scheduled for implant exchange next month.   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 We have previously discussed risk reducing NSM with immediate expander placement. Since last visit here, palpated right breast mass. Diagnostic MMG 09/06/20 showed a 1.4 cm mass in the left breast at the 4 o'clock position consistent with a cyst, and a 0.7 cm mass in the mid-axilla of the right breast. Biopsy demonstrated IDC with DCIS, ER/PR+, Her2-.   Final pathology right breast 0.7 cm IDC with DCIS, margins clear, 0/4 SLN.  Plan tamoxifen 10 years duration. Scheduled for BSO with Dr. Denman George 01/2021.  Mother and MGM also with breast ca. No family members have had reconstruction to date.   Prior 34 B, desires bit larger. Right mastectomy 403 g Left mastectomy 387 g  Lives with spouse and kids ages 61 and 39. Works as Surveyor, mining for [employer].  Review of Systems     Objective:   Physical Exam Cardiovascular:     Rate and Rhythm: Normal rate and regular rhythm.     Heart sounds: Normal heart sounds.  Pulmonary:     Effort: Pulmonary effort is normal.     Breath sounds: Normal breath sounds.    Chest: bilateral chest expanded soft SN to nipple R 22 L 22 cm Nipple to IMF R 9 L 9  cm  Abd soft     Assessment:     Right breast cancer UOQ ER+ BRCA1 S/p bilateral NSM, right SLN, prepectoral TE/ADM (Alloderm) reconstruction    Plan:     Hold tamoxifen prior to surgery- she has already done this.  Plan removal bilateral chest tissue expanders and placement silicone implants, lipofilling to bilateral chest.   Reviewed saline vs silicone, shaped v round.As inprepectoral position I recommend HCG or capacity filled silicone implants to reduce risk visible rippling. Reviewed MRI or  USsurveillance for rupture with silicone implants.Reviewed examples for 4th generation, capacity filled 4th generation, and HCG implants vs saline implants.Has elected for silicone. Plan smooth round. Desires larger than present in expander. Counseled cannot assure cupe size, implant selection determined in part by width chest.  Discussed purpose fat grafting to thicken flaps, reduce visible rippling. Reviewed variable take graft, may need to repeat, fat necrosis that presents as masses, abdominal incisions, pain need for compression.She desires to proceed with this. Plan abdominal and flank donor site. Recommend she purchase Spanx type garment for post op use.  Additional risks including but not limited to bleeding seroma hematoma damage to adjacent structures blood clots in legs or lungs unacceptable cosmetic result need for additional surgery reviewed.  Reviewed OP surgery and post op limitations for next surgery.  Completed [facility] Reconstruction physician patient checklist.  Rx for oxycodone robaxin Bactrim and Zofran given   Natrelle 133S FV-12-T 400 ml tissue expanders placed bilateral fill volume 380 ml saline bilateral

## 2020-12-26 ENCOUNTER — Ambulatory Visit: Payer: BC Managed Care – PPO | Attending: General Surgery

## 2020-12-26 ENCOUNTER — Other Ambulatory Visit: Payer: Self-pay

## 2020-12-26 DIAGNOSIS — C50411 Malignant neoplasm of upper-outer quadrant of right female breast: Secondary | ICD-10-CM | POA: Diagnosis present

## 2020-12-26 DIAGNOSIS — R293 Abnormal posture: Secondary | ICD-10-CM

## 2020-12-26 DIAGNOSIS — Z170 Estrogen receptor positive status [ER+]: Secondary | ICD-10-CM | POA: Insufficient documentation

## 2020-12-26 DIAGNOSIS — M25611 Stiffness of right shoulder, not elsewhere classified: Secondary | ICD-10-CM | POA: Diagnosis present

## 2020-12-26 DIAGNOSIS — Z483 Aftercare following surgery for neoplasm: Secondary | ICD-10-CM | POA: Diagnosis present

## 2020-12-26 DIAGNOSIS — M25612 Stiffness of left shoulder, not elsewhere classified: Secondary | ICD-10-CM | POA: Diagnosis present

## 2020-12-26 NOTE — Therapy (Signed)
[place], [place], [address] Phone: [phone]   Fax:  [phone]  Physical Therapy Treatment  Patient Details  Name: Pamela [name] MRN: [id number] Date of Birth: Dec 30, 1975 Referring Provider (PT): Dr. Donne Hazel   Encounter Date: 12/26/2020   PT End of Session - 12/26/20 1400    Visit Number 12    Number of Visits 17    Date for PT Re-Evaluation 01/02/21    PT Start Time 8546    PT Stop Time 1359    PT Time Calculation (min) 54 min    Activity Tolerance Patient tolerated treatment well    Behavior During Therapy [facility] for tasks assessed/performed           Past Medical History:  Diagnosis Date  . 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 Family history of BRCA1 gene positive   . Family history of breast cancer   . Family history of colon cancer   . Family history of prostate cancer     Past Surgical History:  Procedure Laterality Date  . BREAST RECONSTRUCTION WITH PLACEMENT OF TISSUE EXPANDER AND ALLODERM Bilateral 10/12/2020   Procedure: BILATERAL BREAST RECONSTRUCTION WITH PLACEMENT OF TISSUE EXPANDER AND ALLODERM;  Surgeon: Irene Limbo, MD;  Location: [place];  Service: Plastics;  Laterality: Bilateral;  . CERVICAL DISC SURGERY     ruptured disc neck with prosthetic replacement  . GANGLION CYST EXCISION Left    left wrist  . NIPPLE SPARING MASTECTOMY WITH SENTINEL LYMPH NODE BIOPSY Bilateral 10/12/2020   Procedure: BILATERAL NIPPLE SPARING MASTECTOMY WITH RIGHT AXILLARY SENTINEL LYMPH NODE BIOPSY;  Surgeon: Rolm Bookbinder, MD;  Location: [place];  Service: General;  Laterality: Bilateral;  [place], RNFA  . TUBAL LIGATION      There were no vitals filed for this visit.   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 No/denies                             [facility] Adult PT Treatment/Exercise - 12/26/20 0001      Shoulder Exercises: Pulleys   Flexion 2 minutes    ABduction 2 minutes      Shoulder Exercises: Therapy Ball   Flexion Both;10 reps   forward lean into end of stretch   ABduction Right;Left;10 reps   same side lean into end of stretch     Manual Therapy   Soft tissue mobilization In Lt S/L to Rt medial scapular border for multiple trigger points with cocoa butter, especially where new tightness palpated from pt pushing the medcart at work; then in supine to Rt upper trap    Myofascial Release to bilateral axilla    Scapular Mobilization In Lt S/L to Rt scapula into protraction and retraction    Passive ROM In Supine to bilateral shoulders into flexion abduction, and D2 to pts tolerance  PT Long Term Goals - 12/05/20 1008      PT LONG TERM GOAL #1   Title Pt will return to baseline ROM measurements and not demonstrate any signs/symptoms of lymphedema.    Time 8    Period Weeks    Status On-going      PT LONG TERM GOAL #2   Title Pt will demonstrate 170 degrees of bilateral shoulder flexion to allow pt to reach overhead.    Baseline R 101, L 75; 12/05/20- R 150, L 152    Time 4    Period Weeks    Status On-going      PT LONG TERM GOAL #3   Title Pt will demonstrate 170 degrees of bilateral shoulder abduction to allow her to reach out to the side    Baseline R 75, L 81; 12/05/20- R 122, L 111    Time 4    Period Weeks    Status On-going      PT LONG TERM GOAL #4   Title Pt will be independent in a home  exercise program for continued strengthening and stretching.    Time 4    Period Weeks    Status On-going                 Plan - 12/26/20 1400    Clinical Impression Statement Continued with AA/ROM encouraging pt to stretch into end ROMs. Then continud with focus on manual therapy working to promote increased end bil shoulder P/ROMs. 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 Some relief felt here at end of session.    Stability/Clinical Decision Making Stable/Uncomplicated    Rehab Potential Good    PT Frequency 2x / week    PT Duration 4 weeks    PT Treatment/Interventions ADLs/Self Care Home Management;Patient/family education;Therapeutic exercise;Manual techniques;Passive range of motion    PT Next Visit Plan Contt AA/ROM stretching; Cont manual therapy working to improve Rt>Lt shoulder ROM including STM to Rt medial scapular border and scap mobs; progress HEP    PT Home Exercise Plan post op shoulder ROM; supine over towel roll for bil UE's and scapular mobility; supine scapular series with yellow theraband; bil UE 3 way raises    Consulted and Agree with Plan of Care Patient           Patient will benefit from skilled therapeutic intervention in order to improve the following deficits and impairments:  Postural dysfunction,Decreased knowledge of precautions,Impaired UE functional use,Impaired flexibility,Increased fascial restricitons,Decreased strength,Decreased range of motion  Visit Diagnosis: Stiffness of right shoulder, not elsewhere classified  Stiffness of left shoulder, not elsewhere classified  Aftercare following surgery for neoplasm  Abnormal posture  Malignant neoplasm of upper-outer quadrant of right breast in female, estrogen receptor positive ([place])     Problem List Patient Active Problem List   Diagnosis Date Noted  . Breast cancer, right ([place])  10/12/2020  . History of cervical dysplasia 10/06/2020  . Malignant neoplasm of lower-inner quadrant of right breast of female, estrogen receptor positive ([place]) 09/16/2020  . 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 Family history of BRCA1 gene positive   . Family history of breast cancer   . Family history of prostate cancer   . Family history of colon cancer     Otelia Limes, PTA 12/26/2020, 3:38 PM  [place]  [place], [place], [address] Phone: [phone]   Fax:  [phone]  Name: Pamela [name] MRN: [id number] Date of Birth: [birth year]

## 2020-12-28 ENCOUNTER — Other Ambulatory Visit: Payer: Self-pay

## 2020-12-28 ENCOUNTER — Ambulatory Visit: Payer: BC Managed Care – PPO

## 2020-12-28 DIAGNOSIS — Z170 Estrogen receptor positive status [ER+]: Secondary | ICD-10-CM

## 2020-12-28 DIAGNOSIS — R293 Abnormal posture: Secondary | ICD-10-CM

## 2020-12-28 DIAGNOSIS — M25612 Stiffness of left shoulder, not elsewhere classified: Secondary | ICD-10-CM

## 2020-12-28 DIAGNOSIS — M25611 Stiffness of right shoulder, not elsewhere classified: Secondary | ICD-10-CM

## 2020-12-28 DIAGNOSIS — C50411 Malignant neoplasm of upper-outer quadrant of right female breast: Secondary | ICD-10-CM

## 2020-12-28 DIAGNOSIS — Z483 Aftercare following surgery for neoplasm: Secondary | ICD-10-CM

## 2020-12-28 NOTE — Therapy (Signed)
[name] [address] [place], [place], [address] Phone: [phone]   Fax:  [phone]  Physical Therapy Treatment  Patient Details  Name: Pamela [name] MRN: [id number] Date of Birth: 07/19/1976 Referring Provider (PT): Dr. Wakefield   Encounter Date: 12/28/2020   PT End of Session - 12/28/20 1002    Visit Number 13    Number of Visits 17    Date for PT Re-Evaluation 01/02/21    PT Start Time 0904    PT Stop Time 1001    PT Time Calculation (min) 57 min    Activity Tolerance Patient tolerated treatment well    Behavior During Therapy WFL for tasks assessed/performed           Past Medical History:  Diagnosis Date  . 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 Family history of BRCA1 gene positive   . Family history of breast cancer   . Family history of colon cancer   . Family history of prostate cancer     Past Surgical History:  Procedure Laterality Date  . BREAST RECONSTRUCTION WITH PLACEMENT OF TISSUE EXPANDER AND ALLODERM Bilateral 10/12/2020   Procedure: BILATERAL BREAST RECONSTRUCTION WITH PLACEMENT OF TISSUE EXPANDER AND ALLODERM;  Surgeon: Thimmappa, Brinda, MD;  Location: [facility];  Service: Plastics;  Laterality: Bilateral;  . CERVICAL DISC SURGERY     ruptured disc neck with prosthetic replacement  . GANGLION CYST EXCISION Left    left wrist  . NIPPLE SPARING MASTECTOMY WITH SENTINEL LYMPH NODE BIOPSY Bilateral 10/12/2020   Procedure: BILATERAL NIPPLE SPARING MASTECTOMY WITH RIGHT AXILLARY SENTINEL LYMPH NODE BIOPSY;  Surgeon: Wakefield, Matthew, MD;  Location: [facility];  Service: General;  Laterality: Bilateral;  [place], RNFA  . TUBAL LIGATION      There were no vitals filed for this visit.   Subjective Assessment - 12/28/20 0906    Subjective I can't wait to have my surgery and get past all this. The catch at my Rt shoulder blade is better today than it was  the other day. Now I just feel it when I turn my head a certain way.    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 No/denies                             [facility] Adult PT Treatment/Exercise - 12/28/20 0001      Manual Therapy   Soft tissue mobilization In Lt S/L to Rt medial scapular border for multiple trigger points with cocoa butter, especially where new tightness palpated from pt pushing the medcart at work; then in supine to Rt upper trap    Myofascial Release to bilateral axilla, some cording palpable today at Rt axilla    Scapular Mobilization In Lt S/L to Rt scapula into protraction and retraction    Passive ROM In Supine to bilateral shoulders into flexion abduction, and D2 to pts tolerance                       PT Long Term Goals - 12/05/20 1008      PT LONG TERM GOAL #1   Title   Pt will return to baseline ROM measurements and not demonstrate any signs/symptoms of lymphedema.    Time 8    Period Weeks    Status On-going      PT LONG TERM GOAL #2   Title Pt will demonstrate 170 degrees of bilateral shoulder flexion to allow pt to reach overhead.    Baseline R 101, L 75; 12/05/20- R 150, L 152    Time 4    Period Weeks    Status On-going      PT LONG TERM GOAL #3   Title Pt will demonstrate 170 degrees of bilateral shoulder abduction to allow her to reach out to the side    Baseline R 75, L 81; 12/05/20- R 122, L 111    Time 4    Period Weeks    Status On-going      PT LONG TERM GOAL #4   Title Pt will be independent in a home exercise program for continued strengthening and stretching.    Time 4    Period Weeks    Status On-going                 Plan - 12/28/20 1002    Clinical Impression  Statement Continued with focus on manual therapy working to improve end P/ROM to help pt be better prepared for upcoming implant exchange surgery next week. Her recent flare up at Rt scapula causing pinch with Rt cervical A/ROM is much improved today from last session, though she still has multiple trigger points palpable at Rt medial scapular border that soften minimally after STM. Pt conts to report decrease in tightness felt after each session.    Stability/Clinical Decision Making Stable/Uncomplicated    Rehab Potential Good    PT Frequency 2x / week    PT Duration 4 weeks    PT Treatment/Interventions ADLs/Self Care Home Management;Patient/family education;Therapeutic exercise;Manual techniques;Passive range of motion    PT Next Visit Plan Pt to be placed on hold after next session due to upcoming implant exchange surgery; Cont AA/ROM stretching; Cont manual therapy working to improve Rt>Lt shoulder ROM including STM to Rt medial scapular border and scap mobs; progress HEP    PT Home Exercise Plan post op shoulder ROM; supine over towel roll for bil UE's and scapular mobility; supine scapular series with yellow theraband; bil UE 3 way raises    Consulted and Agree with Plan of Care Patient           Patient will benefit from skilled therapeutic intervention in order to improve the following deficits and impairments:  Postural dysfunction,Decreased knowledge of precautions,Impaired UE functional use,Impaired flexibility,Increased fascial restricitons,Decreased strength,Decreased range of motion  Visit Diagnosis: Stiffness of right shoulder, not elsewhere classified  Stiffness of left shoulder, not elsewhere classified  Aftercare following surgery for neoplasm  Abnormal posture  Malignant neoplasm of upper-outer quadrant of right breast in female, estrogen receptor positive (HCC)     Problem List Patient Active Problem List   Diagnosis Date Noted  . Breast cancer, right (HCC)  10/12/2020  . History of cervical dysplasia 10/06/2020  . Malignant neoplasm of lower-inner quadrant of right breast of female, estrogen receptor positive (HCC) 09/16/2020  . 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 Family history of BRCA1 gene positive   . Family history of breast cancer   . Family history of prostate cancer   . Family history of colon cancer     [name], Pamela Ann, PTA 12/28/2020, 10:06   [place], [place], [address] Phone: [phone]   Fax:  [phone]  Name: Pamela [name] MRN: [id number] Date of Birth: 03-28-1976

## 2020-12-30 ENCOUNTER — Other Ambulatory Visit: Payer: Self-pay

## 2020-12-30 ENCOUNTER — Encounter (HOSPITAL_BASED_OUTPATIENT_CLINIC_OR_DEPARTMENT_OTHER): Payer: Self-pay | Admitting: Plastic Surgery

## 2021-01-02 ENCOUNTER — Other Ambulatory Visit: Payer: Self-pay

## 2021-01-02 ENCOUNTER — Ambulatory Visit: Payer: BC Managed Care – PPO

## 2021-01-02 DIAGNOSIS — M25612 Stiffness of left shoulder, not elsewhere classified: Secondary | ICD-10-CM

## 2021-01-02 DIAGNOSIS — Z483 Aftercare following surgery for neoplasm: Secondary | ICD-10-CM

## 2021-01-02 DIAGNOSIS — C50411 Malignant neoplasm of upper-outer quadrant of right female breast: Secondary | ICD-10-CM

## 2021-01-02 DIAGNOSIS — M25611 Stiffness of right shoulder, not elsewhere classified: Secondary | ICD-10-CM

## 2021-01-02 DIAGNOSIS — Z170 Estrogen receptor positive status [ER+]: Secondary | ICD-10-CM

## 2021-01-02 DIAGNOSIS — R293 Abnormal posture: Secondary | ICD-10-CM

## 2021-01-02 NOTE — Progress Notes (Signed)

## 2021-01-02 NOTE — Therapy (Signed)
[place], [place], [address] Phone: [phone]   Fax:  [phone]  Physical Therapy Treatment  Patient Details  Name: Pamela [name] MRN: [id number] Date of Birth: Jan 12, 1976 Referring Provider (PT): Dr. Donne Hazel   Encounter Date: 01/02/2021   PT End of Session - 01/02/21 1206    Date for PT Re-Evaluation 01/02/21    PT Start Time 1102    PT Stop Time 1204    PT Time Calculation (min) 62 min    Activity Tolerance Patient tolerated treatment well    Behavior During Therapy [facility] for tasks assessed/performed           Past Medical History:  Diagnosis Date  . 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 Family history of BRCA1 gene positive   . Family history of breast cancer   . Family history of colon cancer   . Family history of prostate cancer   . PONV (postoperative nausea and vomiting)     Past Surgical History:  Procedure Laterality Date  . BREAST RECONSTRUCTION WITH PLACEMENT OF TISSUE EXPANDER AND ALLODERM Bilateral 10/12/2020   Procedure: BILATERAL BREAST RECONSTRUCTION WITH PLACEMENT OF TISSUE EXPANDER AND ALLODERM;  Surgeon: Irene Limbo, MD;  Location: [facility];  Service: Plastics;  Laterality: Bilateral;  . CERVICAL DISC SURGERY     ruptured disc neck with prosthetic replacement  . GANGLION CYST EXCISION Left    left wrist  . NIPPLE SPARING MASTECTOMY WITH SENTINEL LYMPH NODE BIOPSY Bilateral 10/12/2020   Procedure: BILATERAL NIPPLE SPARING MASTECTOMY WITH RIGHT AXILLARY SENTINEL LYMPH NODE BIOPSY;  Surgeon: Rolm Bookbinder, MD;  Location: [facility];  Service: General;  Laterality: Bilateral;  [place], RNFA  . TUBAL LIGATION      There were no vitals filed for this visit.   Subjective Assessment - 01/02/21 1106    Subjective My Rt scapula and the pinch at my upper trap has been better the last few days. I can tell the Rt axilla tightness is  getting better too. Just ready to get my surgery behind me.    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 No/denies              [facility] PT Assessment - 01/02/21 0001      AROM   Right Shoulder Flexion 147 Degrees    Right Shoulder ABduction 164 Degrees    Left Shoulder Flexion 156 Degrees    Left Shoulder ABduction 142 Degrees                         [facility] Adult PT Treatment/Exercise - 01/02/21 0001      Manual Therapy   Soft tissue mobilization In Lt S/L to Rt medial scapular border for multiple trigger points with cocoa butter, also when supine to Rt upper trap    Myofascial Release to Rt axilla, where mild cording still palpable    Scapular Mobilization In Lt S/L to Rt scapula into protraction and retraction, also prolonged holds away from ribcage    Passive ROM In Supine to Rt shoulder into flexion abduction, and D2 to pts available end motions  PT Long Term Goals - 12/05/20 1008      PT LONG TERM GOAL #1   Title Pt will return to baseline ROM measurements and not demonstrate any signs/symptoms of lymphedema.    Time 8    Period Weeks    Status On-going      PT LONG TERM GOAL #2   Title Pt will demonstrate 170 degrees of bilateral shoulder flexion to allow pt to reach overhead.    Baseline R 101, L 75; 12/05/20- R 150, L 152    Time 4    Period Weeks    Status On-going      PT LONG TERM GOAL #3   Title Pt will demonstrate 170 degrees of bilateral shoulder abduction to allow her to reach out to the side    Baseline R 75, L 81; 12/05/20- R 122, L 111    Time 4    Period Weeks    Status On-going      PT LONG TERM GOAL #4   Title Pt will be independent in a home exercise program  for continued strengthening and stretching.    Time 4    Period Weeks    Status On-going                 Plan - 01/02/21 1206    Clinical Impression Statement Continued with manual therapy focusing only on her Rt upper quadrant today as this is most limiting for pt. Her bil abduction has improved well since start of care, still limited with flexion due to bil axillary and pectoralis tightness. She has her implant exchange surgery this Friday, 01/06/21, so pt will be on hold until after that. Updated goals today as well.    Stability/Clinical Decision Making Stable/Uncomplicated    Rehab Potential Good    PT Frequency 2x / week    PT Duration 4 weeks    PT Treatment/Interventions ADLs/Self Care Home Management;Patient/family education;Therapeutic exercise;Manual techniques;Passive range of motion    PT Next Visit Plan Pt will need reassess if she returns for treatment asthis will be after her implant exchange surgery; has her L-Dex screen 01/12/21    PT Home Exercise Plan post op shoulder ROM; supine over towel roll for bil UE's and scapular mobility; supine scapular series with yellow theraband; bil UE 3 way raises    Consulted and Agree with Plan of Care Patient           Patient will benefit from skilled therapeutic intervention in order to improve the following deficits and impairments:  Postural dysfunction,Decreased knowledge of precautions,Impaired UE functional use,Impaired flexibility,Increased fascial restricitons,Decreased strength,Decreased range of motion  Visit Diagnosis: Stiffness of right shoulder, not elsewhere classified  Stiffness of left shoulder, not elsewhere classified  Aftercare following surgery for neoplasm  Abnormal posture  Malignant neoplasm of upper-outer quadrant of right breast in female, estrogen receptor positive ([facility])     Problem List Patient Active Problem List   Diagnosis Date Noted  . Breast cancer, right ([facility]) 10/12/2020  . History  of cervical dysplasia 10/06/2020  . Malignant neoplasm of lower-inner quadrant of right breast of female, estrogen receptor positive ([facility]) 09/16/2020  . 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 Family history of BRCA1 gene positive   . Family history of breast cancer   . Family history of prostate cancer   . Family history of colon cancer     Otelia Limes, PTA 01/02/2021, 12:10 PM  [facility]  [place], [place], [address] Phone: [phone]   Fax:  [phone]  Name: Pamela [name] MRN: [id number] Date of Birth: May 02, 1976

## 2021-01-03 ENCOUNTER — Other Ambulatory Visit (HOSPITAL_COMMUNITY)
Admission: RE | Admit: 2021-01-03 | Discharge: 2021-01-03 | Disposition: A | Discharge status: Home / Self Care | Payer: BC Managed Care – PPO | Source: Ambulatory Visit | Attending: Plastic Surgery | Admitting: Plastic Surgery

## 2021-01-03 DIAGNOSIS — Z01812 Encounter for preprocedural laboratory examination: Secondary | ICD-10-CM | POA: Insufficient documentation

## 2021-01-03 DIAGNOSIS — Z803 Family history of malignant neoplasm of breast: Secondary | ICD-10-CM | POA: Diagnosis not present

## 2021-01-03 DIAGNOSIS — Z87891 Personal history of nicotine dependence: Secondary | ICD-10-CM | POA: Diagnosis not present

## 2021-01-03 DIAGNOSIS — Z20822 Contact with and (suspected) exposure to covid-19: Secondary | ICD-10-CM | POA: Diagnosis not present

## 2021-01-03 DIAGNOSIS — C50511 Malignant neoplasm of lower-outer quadrant of right female breast: Secondary | ICD-10-CM | POA: Diagnosis not present

## 2021-01-03 DIAGNOSIS — Z170 Estrogen receptor positive status [ER+]: Secondary | ICD-10-CM | POA: Diagnosis not present

## 2021-01-03 DIAGNOSIS — Z421 Encounter for breast reconstruction following mastectomy: Secondary | ICD-10-CM | POA: Diagnosis present

## 2021-01-03 DIAGNOSIS — Z1501 Genetic susceptibility to malignant neoplasm of breast: Secondary | ICD-10-CM | POA: Diagnosis not present

## 2021-01-03 LAB — SARS CORONAVIRUS 2 (TAT 6-24 HRS): SARS Coronavirus 2: NEGATIVE

## 2021-01-06 ENCOUNTER — Ambulatory Visit (HOSPITAL_BASED_OUTPATIENT_CLINIC_OR_DEPARTMENT_OTHER): Payer: BC Managed Care – PPO | Admitting: Certified Registered Nurse Anesthetist

## 2021-01-06 ENCOUNTER — Ambulatory Visit (HOSPITAL_BASED_OUTPATIENT_CLINIC_OR_DEPARTMENT_OTHER)
Admission: RE | Admit: 2021-01-06 | Discharge: 2021-01-06 | Disposition: A | Discharge status: Home / Self Care | Payer: BC Managed Care – PPO | Attending: Plastic Surgery | Admitting: Plastic Surgery

## 2021-01-06 ENCOUNTER — Other Ambulatory Visit: Payer: Self-pay

## 2021-01-06 ENCOUNTER — Encounter (HOSPITAL_BASED_OUTPATIENT_CLINIC_OR_DEPARTMENT_OTHER)
Admission: RE | Disposition: A | Discharge status: Home / Self Care | Payer: Self-pay | Source: Home / Self Care | Attending: Plastic Surgery

## 2021-01-06 ENCOUNTER — Encounter (HOSPITAL_BASED_OUTPATIENT_CLINIC_OR_DEPARTMENT_OTHER): Payer: Self-pay | Admitting: Plastic Surgery

## 2021-01-06 DIAGNOSIS — Z20822 Contact with and (suspected) exposure to covid-19: Secondary | ICD-10-CM | POA: Insufficient documentation

## 2021-01-06 DIAGNOSIS — C50511 Malignant neoplasm of lower-outer quadrant of right female breast: Secondary | ICD-10-CM | POA: Insufficient documentation

## 2021-01-06 DIAGNOSIS — Z170 Estrogen receptor positive status [ER+]: Secondary | ICD-10-CM | POA: Insufficient documentation

## 2021-01-06 DIAGNOSIS — Z1501 Genetic susceptibility to malignant neoplasm of breast: Secondary | ICD-10-CM | POA: Insufficient documentation

## 2021-01-06 DIAGNOSIS — Z87891 Personal history of nicotine dependence: Secondary | ICD-10-CM | POA: Insufficient documentation

## 2021-01-06 DIAGNOSIS — Z421 Encounter for breast reconstruction following mastectomy: Secondary | ICD-10-CM | POA: Diagnosis not present

## 2021-01-06 DIAGNOSIS — Z803 Family history of malignant neoplasm of breast: Secondary | ICD-10-CM | POA: Insufficient documentation

## 2021-01-06 HISTORY — DX: Other specified postprocedural states: Z98.890

## 2021-01-06 HISTORY — DX: Nausea with vomiting, unspecified: R11.2

## 2021-01-06 HISTORY — PX: REMOVAL OF BILATERAL TISSUE EXPANDERS WITH PLACEMENT OF BILATERAL BREAST IMPLANTS: SHX6431

## 2021-01-06 HISTORY — PX: LIPOSUCTION WITH LIPOFILLING: SHX6436

## 2021-01-06 LAB — POCT PREGNANCY, URINE: Preg Test, Ur: NEGATIVE

## 2021-01-06 SURGERY — REMOVAL, TISSUE EXPANDER, BREAST, BILATERAL, WITH BILATERAL IMPLANT IMPLANT INSERTION
Anesthesia: General | Site: Chest | Laterality: Bilateral

## 2021-01-06 MED ORDER — EPHEDRINE SULFATE 50 MG/ML IJ SOLN
INTRAMUSCULAR | Status: DC | PRN
  Administered 2021-01-06: 10 mg via INTRAVENOUS
  Administered 2021-01-06: 5 mg via INTRAVENOUS

## 2021-01-06 MED ORDER — OXYCODONE HCL 5 MG PO TABS
ORAL_TABLET | ORAL | Status: AC
  Filled 2021-01-06: qty 1

## 2021-01-06 MED ORDER — CEFAZOLIN SODIUM-DEXTROSE 2-4 GM/100ML-% IV SOLN
INTRAVENOUS | Status: AC
  Filled 2021-01-06: qty 100

## 2021-01-06 MED ORDER — FENTANYL CITRATE (PF) 100 MCG/2ML IJ SOLN
INTRAMUSCULAR | Status: AC
  Filled 2021-01-06: qty 2

## 2021-01-06 MED ORDER — ONDANSETRON HCL 4 MG/2ML IJ SOLN
INTRAMUSCULAR | Status: AC
  Filled 2021-01-06: qty 2

## 2021-01-06 MED ORDER — DROPERIDOL 2.5 MG/ML IJ SOLN
INTRAMUSCULAR | Status: DC | PRN
  Administered 2021-01-06: .625 mg via INTRAVENOUS

## 2021-01-06 MED ORDER — LACTATED RINGERS IV SOLN: INTRAVENOUS | Status: DC | PRN

## 2021-01-06 MED ORDER — FENTANYL CITRATE (PF) 100 MCG/2ML IJ SOLN
INTRAMUSCULAR | Status: DC | PRN
  Administered 2021-01-06: 100 ug via INTRAVENOUS

## 2021-01-06 MED ORDER — ROCURONIUM BROMIDE 10 MG/ML (PF) SYRINGE
PREFILLED_SYRINGE | INTRAVENOUS | Status: AC
  Filled 2021-01-06: qty 10

## 2021-01-06 MED ORDER — HYDROMORPHONE HCL 1 MG/ML IJ SOLN
INTRAMUSCULAR | Status: AC
  Filled 2021-01-06: qty 0.5

## 2021-01-06 MED ORDER — ROCURONIUM BROMIDE 100 MG/10ML IV SOLN
INTRAVENOUS | Status: DC | PRN
  Administered 2021-01-06: 70 mg via INTRAVENOUS

## 2021-01-06 MED ORDER — KETOROLAC TROMETHAMINE 30 MG/ML IJ SOLN
INTRAMUSCULAR | Status: AC
  Filled 2021-01-06: qty 1

## 2021-01-06 MED ORDER — SODIUM CHLORIDE 0.9 % IV SOLN
INTRAVENOUS | Status: AC
  Filled 2021-01-06: qty 10

## 2021-01-06 MED ORDER — LACTATED RINGERS IV SOLN: INTRAVENOUS | Status: DC

## 2021-01-06 MED ORDER — POVIDONE-IODINE 10 % EX SOLN
CUTANEOUS | Status: DC | PRN
  Administered 2021-01-06: 1 via TOPICAL

## 2021-01-06 MED ORDER — ACETAMINOPHEN 500 MG PO TABS
1000.0000 mg | ORAL_TABLET | ORAL | Status: AC
  Administered 2021-01-06: 1000 mg via ORAL

## 2021-01-06 MED ORDER — DEXAMETHASONE SODIUM PHOSPHATE 10 MG/ML IJ SOLN
INTRAMUSCULAR | Status: DC | PRN
  Administered 2021-01-06: 10 mg via INTRAVENOUS

## 2021-01-06 MED ORDER — PROMETHAZINE HCL 25 MG/ML IJ SOLN: 6.2500 mg | INTRAMUSCULAR | Status: DC | PRN

## 2021-01-06 MED ORDER — LIDOCAINE 2% (20 MG/ML) 5 ML SYRINGE
INTRAMUSCULAR | Status: AC
  Filled 2021-01-06: qty 5

## 2021-01-06 MED ORDER — SODIUM BICARBONATE 4.2 % IV SOLN
INTRAVENOUS | Status: DC | PRN
  Administered 2021-01-06: 400 mL via INTRAMUSCULAR

## 2021-01-06 MED ORDER — CEFAZOLIN SODIUM-DEXTROSE 2-4 GM/100ML-% IV SOLN
2.0000 g | INTRAVENOUS | Status: AC
  Administered 2021-01-06: 2 g via INTRAVENOUS

## 2021-01-06 MED ORDER — PROPOFOL 10 MG/ML IV BOLUS
INTRAVENOUS | Status: AC
  Filled 2021-01-06: qty 20

## 2021-01-06 MED ORDER — SODIUM CHLORIDE 0.9 % IV SOLN
INTRAVENOUS | Status: DC | PRN
  Administered 2021-01-06: 500 mL

## 2021-01-06 MED ORDER — MIDAZOLAM HCL 5 MG/5ML IJ SOLN
INTRAMUSCULAR | Status: DC | PRN
  Administered 2021-01-06: 2 mg via INTRAVENOUS

## 2021-01-06 MED ORDER — EPHEDRINE 5 MG/ML INJ
INTRAVENOUS | Status: AC
  Filled 2021-01-06: qty 10

## 2021-01-06 MED ORDER — SCOPOLAMINE 1 MG/3DAYS TD PT72
1.0000 | MEDICATED_PATCH | TRANSDERMAL | Status: DC
  Administered 2021-01-06: 1.5 mg via TRANSDERMAL

## 2021-01-06 MED ORDER — ONDANSETRON HCL 4 MG/2ML IJ SOLN
INTRAMUSCULAR | Status: DC | PRN
  Administered 2021-01-06 (×2): 4 mg via INTRAVENOUS

## 2021-01-06 MED ORDER — KETOROLAC TROMETHAMINE 30 MG/ML IJ SOLN
INTRAMUSCULAR | Status: DC | PRN
  Administered 2021-01-06: 30 mg via INTRAVENOUS

## 2021-01-06 MED ORDER — GABAPENTIN 300 MG PO CAPS
300.0000 mg | ORAL_CAPSULE | ORAL | Status: AC
  Administered 2021-01-06: 300 mg via ORAL

## 2021-01-06 MED ORDER — GABAPENTIN 300 MG PO CAPS
ORAL_CAPSULE | ORAL | Status: AC
  Filled 2021-01-06: qty 1

## 2021-01-06 MED ORDER — CELECOXIB 200 MG PO CAPS
ORAL_CAPSULE | ORAL | Status: AC
  Filled 2021-01-06: qty 1

## 2021-01-06 MED ORDER — MIDAZOLAM HCL 2 MG/2ML IJ SOLN
INTRAMUSCULAR | Status: AC
  Filled 2021-01-06: qty 2

## 2021-01-06 MED ORDER — HYDROMORPHONE HCL 1 MG/ML IJ SOLN
0.2500 mg | INTRAMUSCULAR | Status: DC | PRN
  Administered 2021-01-06 (×3): 0.5 mg via INTRAVENOUS

## 2021-01-06 MED ORDER — OXYCODONE HCL 5 MG/5ML PO SOLN: 5.0000 mg | Freq: Once | ORAL | Status: AC | PRN

## 2021-01-06 MED ORDER — ACETAMINOPHEN 500 MG PO TABS
ORAL_TABLET | ORAL | Status: AC
  Filled 2021-01-06: qty 2

## 2021-01-06 MED ORDER — PHENYLEPHRINE 40 MCG/ML (10ML) SYRINGE FOR IV PUSH (FOR BLOOD PRESSURE SUPPORT)
PREFILLED_SYRINGE | INTRAVENOUS | Status: AC
  Filled 2021-01-06: qty 10

## 2021-01-06 MED ORDER — MEPERIDINE HCL 25 MG/ML IJ SOLN: 6.2500 mg | INTRAMUSCULAR | Status: DC | PRN

## 2021-01-06 MED ORDER — CELECOXIB 200 MG PO CAPS
200.0000 mg | ORAL_CAPSULE | ORAL | Status: AC
  Administered 2021-01-06: 200 mg via ORAL

## 2021-01-06 MED ORDER — SCOPOLAMINE 1 MG/3DAYS TD PT72
MEDICATED_PATCH | TRANSDERMAL | Status: AC
  Filled 2021-01-06: qty 1

## 2021-01-06 MED ORDER — PHENYLEPHRINE 40 MCG/ML (10ML) SYRINGE FOR IV PUSH (FOR BLOOD PRESSURE SUPPORT)
PREFILLED_SYRINGE | INTRAVENOUS | Status: DC | PRN
  Administered 2021-01-06 (×3): 120 ug via INTRAVENOUS

## 2021-01-06 MED ORDER — OXYCODONE HCL 5 MG PO TABS
5.0000 mg | ORAL_TABLET | Freq: Once | ORAL | Status: AC | PRN
  Administered 2021-01-06: 5 mg via ORAL

## 2021-01-06 MED ORDER — DEXAMETHASONE SODIUM PHOSPHATE 10 MG/ML IJ SOLN
INTRAMUSCULAR | Status: AC
  Filled 2021-01-06: qty 1

## 2021-01-06 MED ORDER — PROPOFOL 10 MG/ML IV BOLUS
INTRAVENOUS | Status: DC | PRN
  Administered 2021-01-06: 170 mg via INTRAVENOUS

## 2021-01-06 MED ORDER — DROPERIDOL 2.5 MG/ML IJ SOLN
INTRAMUSCULAR | Status: AC
  Filled 2021-01-06: qty 2

## 2021-01-06 SURGICAL SUPPLY — 85 items
ADH SKN CLS APL DERMABOND .7 (GAUZE/BANDAGES/DRESSINGS)
APL PRP STRL LF DISP 70% ISPRP (MISCELLANEOUS)
BAG DECANTER FOR FLEXI CONT (MISCELLANEOUS) ×3 IMPLANT
BINDER ABDOMINAL 10 UNV 27-48 (MISCELLANEOUS) ×3 IMPLANT
BINDER ABDOMINAL 12 SM 30-45 (SOFTGOODS) IMPLANT
BINDER BREAST 3XL (GAUZE/BANDAGES/DRESSINGS) IMPLANT
BINDER BREAST LRG (GAUZE/BANDAGES/DRESSINGS) IMPLANT
BINDER BREAST MEDIUM (GAUZE/BANDAGES/DRESSINGS) ×1 IMPLANT
BINDER BREAST XLRG (GAUZE/BANDAGES/DRESSINGS) IMPLANT
BINDER BREAST XXLRG (GAUZE/BANDAGES/DRESSINGS) IMPLANT
BLADE SURG 10 STRL SS (BLADE) ×3 IMPLANT
BLADE SURG 11 STRL SS (BLADE) ×3 IMPLANT
BNDG GAUZE ELAST 4 BULKY (GAUZE/BANDAGES/DRESSINGS) ×6 IMPLANT
CANISTER LIPO FAT HARVEST (MISCELLANEOUS) ×1 IMPLANT
CANISTER SUCT 1200ML W/VALVE (MISCELLANEOUS) ×3 IMPLANT
CHLORAPREP W/TINT 26 (MISCELLANEOUS) ×2 IMPLANT
COVER BACK TABLE 60X90IN (DRAPES) ×3 IMPLANT
COVER MAYO STAND STRL (DRAPES) ×6 IMPLANT
COVER WAND RF STERILE (DRAPES) IMPLANT
DECANTER SPIKE VIAL GLASS SM (MISCELLANEOUS) IMPLANT
DERMABOND ADVANCED (GAUZE/BANDAGES/DRESSINGS)
DERMABOND ADVANCED .7 DNX12 (GAUZE/BANDAGES/DRESSINGS) IMPLANT
DRAIN CHANNEL 15F RND FF W/TCR (WOUND CARE) IMPLANT
DRAPE TOP ARMCOVERS (MISCELLANEOUS) ×3 IMPLANT
DRAPE U-SHAPE 76X120 STRL (DRAPES) ×3 IMPLANT
DRAPE UTILITY XL STRL (DRAPES) ×4 IMPLANT
DRSG PAD ABDOMINAL 8X10 ST (GAUZE/BANDAGES/DRESSINGS) ×8 IMPLANT
ELECT BLADE 4.0 EZ CLEAN MEGAD (MISCELLANEOUS) ×3
ELECT COATED BLADE 2.86 ST (ELECTRODE) ×3 IMPLANT
ELECT REM PT RETURN 9FT ADLT (ELECTROSURGICAL) ×3
ELECTRODE BLDE 4.0 EZ CLN MEGD (MISCELLANEOUS) ×2 IMPLANT
ELECTRODE REM PT RTRN 9FT ADLT (ELECTROSURGICAL) ×2 IMPLANT
EVACUATOR SILICONE 100CC (DRAIN) IMPLANT
EXTRACTOR CANIST REVOLVE STRL (CANNISTER) IMPLANT
GLOVE SURG HYDRASOFT LTX SZ5.5 (GLOVE) ×6 IMPLANT
GOWN STRL REUS W/ TWL LRG LVL3 (GOWN DISPOSABLE) ×4 IMPLANT
GOWN STRL REUS W/TWL LRG LVL3 (GOWN DISPOSABLE) ×6
IMPL BREAST P6.1XRND XFULL 470 (Breast) ×4 IMPLANT
IMPL BRST P6.1XRND XFULL 470CC (Breast) ×4 IMPLANT
IMPLANT BREAST GEL 470CC (Breast) ×6 IMPLANT
KIT FILL SYSTEM UNIVERSAL (SET/KITS/TRAYS/PACK) IMPLANT
LINER CANISTER 1000CC FLEX (MISCELLANEOUS) ×3 IMPLANT
MARKER SKIN DUAL TIP RULER LAB (MISCELLANEOUS) IMPLANT
NDL FILTER BLUNT 18X1 1/2 (NEEDLE) IMPLANT
NDL HYPO 25X1 1.5 SAFETY (NEEDLE) IMPLANT
NDL SAFETY ECLIPSE 18X1.5 (NEEDLE) ×4 IMPLANT
NEEDLE FILTER BLUNT 18X 1/2SAF (NEEDLE) ×1
NEEDLE FILTER BLUNT 18X1 1/2 (NEEDLE) ×2 IMPLANT
NEEDLE HYPO 18GX1.5 SHARP (NEEDLE) ×6
NEEDLE HYPO 25X1 1.5 SAFETY (NEEDLE) IMPLANT
NS IRRIG 1000ML POUR BTL (IV SOLUTION) ×2 IMPLANT
PACK BASIN DAY SURGERY FS (CUSTOM PROCEDURE TRAY) ×3 IMPLANT
PAD ALCOHOL SWAB (MISCELLANEOUS) ×4 IMPLANT
PENCIL SMOKE EVACUATOR (MISCELLANEOUS) ×4 IMPLANT
PIN SAFETY STERILE (MISCELLANEOUS) IMPLANT
SHEET MEDIUM DRAPE 40X70 STRL (DRAPES) ×6 IMPLANT
SIZER BREAST REUSE 470CC (SIZER) ×3
SIZER BREAST REUSE 495CC (SIZER) ×3
SIZER BRST REUSE 470CC (SIZER) ×2 IMPLANT
SIZER BRST REUSE 495CC (SIZER) IMPLANT
SLEEVE SCD COMPRESS KNEE MED (STOCKING) ×3 IMPLANT
SPONGE LAP 18X18 RF (DISPOSABLE) ×6 IMPLANT
STAPLER VISISTAT 35W (STAPLE) ×3 IMPLANT
SUT ETHILON 2 0 FS 18 (SUTURE) IMPLANT
SUT MNCRL AB 4-0 PS2 18 (SUTURE) ×6 IMPLANT
SUT PDS AB 2-0 CT2 27 (SUTURE) IMPLANT
SUT VIC AB 3-0 PS1 18 (SUTURE)
SUT VIC AB 3-0 PS1 18XBRD (SUTURE) IMPLANT
SUT VIC AB 3-0 SH 27 (SUTURE) ×6
SUT VIC AB 3-0 SH 27X BRD (SUTURE) ×4 IMPLANT
SUT VICRYL 4-0 PS2 18IN ABS (SUTURE) ×6 IMPLANT
SYR 10ML LL (SYRINGE) ×11 IMPLANT
SYR 20ML LL LF (SYRINGE) ×2 IMPLANT
SYR 50ML LL SCALE MARK (SYRINGE) ×6 IMPLANT
SYR BULB IRRIG 60ML STRL (SYRINGE) ×6 IMPLANT
SYR CONTROL 10ML LL (SYRINGE) IMPLANT
SYR TB 1ML LL NO SAFETY (SYRINGE) ×3 IMPLANT
SYR TOOMEY IRRIG 70ML (MISCELLANEOUS)
SYRINGE TOOMEY IRRIG 70ML (MISCELLANEOUS) IMPLANT
TOWEL GREEN STERILE FF (TOWEL DISPOSABLE) ×5 IMPLANT
TUBE CONNECTING 20X1/4 (TUBING) ×3 IMPLANT
TUBING INFILTRATION IT-10001 (TUBING) ×3 IMPLANT
TUBING SET GRADUATE ASPIR 12FT (MISCELLANEOUS) ×3 IMPLANT
UNDERPAD 30X36 HEAVY ABSORB (UNDERPADS AND DIAPERS) ×4 IMPLANT
YANKAUER SUCT BULB TIP NO VENT (SUCTIONS) ×3 IMPLANT

## 2021-01-06 NOTE — Anesthesia Preprocedure Evaluation (Signed)
Anesthesia Evaluation  Patient identified by MRN, date of birth, ID band Patient awake    Reviewed: Allergy & Precautions, H&P , NPO status , Patient's Chart, lab work & pertinent test results  History of Anesthesia Complications (+) PONV  Airway Mallampati: II   Neck ROM: full    Dental   Pulmonary former smoker,    breath sounds clear to auscultation       Cardiovascular negative cardio ROS   Rhythm:regular Rate:Normal     Neuro/Psych    GI/Hepatic   Endo/Other    Renal/GU      Musculoskeletal Cervical disc surgery   Abdominal   Peds  Hematology   Anesthesia Other Findings   Reproductive/Obstetrics Breast CA                             Anesthesia Physical  Anesthesia Plan  ASA: II  Anesthesia Plan: General   Post-op Pain Management:    Induction: Intravenous  PONV Risk Score and Plan: 4 or greater and Ondansetron, Dexamethasone, Midazolam, Treatment may vary due to age or medical condition and Droperidol  Airway Management Planned: Oral ETT  Additional Equipment:   Intra-op Plan:   Post-operative Plan: Extubation in OR  Informed Consent: I have reviewed the patients History and Physical, chart, labs and discussed the procedure including the risks, benefits and alternatives for the proposed anesthesia with the patient or authorized representative who has indicated his/her understanding and acceptance.     Dental advisory given  Plan Discussed with: CRNA, Anesthesiologist and Surgeon  Anesthesia Plan Comments:         Anesthesia Quick Evaluation

## 2021-01-06 NOTE — Transfer of Care (Signed)
Immediate Anesthesia Transfer of Care Note  Patient: Pamela [name]  Procedure(s) Performed: REMOVAL OF BILATERAL TISSUE EXPANDERS WITH PLACEMENT OF BILATERAL BREAST SILICONE IMPLANTS (Bilateral Chest) LIPOFILLING FROM ABDOMEN TO BILATERAL CHEST (Bilateral Abdomen)  Patient Location: PACU  Anesthesia Type:General  Level of Consciousness: awake, alert  and oriented  Airway & Oxygen Therapy: Patient Spontanous Breathing and Patient connected to face mask oxygen  Post-op Assessment: Report given to RN and Post -op Vital signs reviewed and stable  Post vital signs: Reviewed and stable  Last Vitals:  Vitals Value Taken Time  BP 125/68 01/06/21 1300  Temp    Pulse 94 01/06/21 1302  Resp 23 01/06/21 1302  SpO2 100 % 01/06/21 1302  Vitals shown include unvalidated device data.  Last Pain:  Vitals:   01/06/21 1016  TempSrc: Oral  PainSc: 0-No pain         Complications: No complications documented.

## 2021-01-06 NOTE — Op Note (Signed)
Operative Note   DATE OF OPERATION: 5.313.22  LOCATION: [facility]  SURGICAL DIVISION: Plastic Surgery  PREOPERATIVE DIAGNOSES:  1. History right breast cancer 2. Acquired absence breasts 3. BRCA1  POSTOPERATIVE DIAGNOSES:  same  PROCEDURE:  1. Removal bilateral chest tissue expanders and placement silicone implants 2. Lipofilling to bilateral chest 100 ml  SURGEON: Irene Limbo MD MBA  ASSISTANT: none  ANESTHESIA:  General.   EBL: 50 ml  COMPLICATIONS: None immediate.   INDICATIONS FOR PROCEDURE:  The patient, Pamela [name], is a 45 y.o. female born on Mar 25, 1976, is here for staged breast reconstruction following nipple sparing mastectomies and immediate prepectoral expander acellular dermis reconstruction.   FINDINGS: Complete incorporation ADM bilateral. 50 ml fat infiltrated over right chest, 50 ml fat infiltrated over left chest. Natrelle Soft Touch Extra Projection 470 ml smooth round implants placed bilateral. REF [id number]-[id number] RIGHT SN [id number] LEFT SN [id number]  DESCRIPTION OF PROCEDURE:  The patient's operative site was marked with the patient in the preoperative area including bilateral flanks and supra and infra umbilcal abdomen for liposuction. The patient was taken to the operating room. SCDs were placed and IV antibiotics were given. The patient's operative site was prepped and draped in a sterile fashion. A time out was performed and all information was confirmed to be correct.I began onleftside. Incision made through priorinframammary foldscar and carried through superficial fascia toacellular demis.ADM incised.Expander removed.Full incorporation ADM noted.Superior capsulotomy performed.Sizer placed.  I then directed attention torightchest. Incision made in prior inframammary fold scar andimplant cavityentered in similar manner. Expander removed and well incorporated ADM noted.Superior capsulotomy performed. Sizer placed. Natrelle  Smooth Round Extra Projection43mimplant selected for bilateral placement.  Stab incision made over bilateralabdomen. Tumescent fluid infiltrated over supra and infraumbilical abdomen and fWHQPRF,[id number] infiltrated. Power assisted liposuction performed to endpoint symmetric contour and soft tissue thickness, total lipoaspirate 2049m The fat was then washed and prepared by gravity for infiltration. Harvested fat was then infiltrated in subcutaneous plane throughout superior pole bilateral mastectomy flaps.  Each cavity irrigated with saline solution containing Betadine, Ancef, gentamicinsolution.Hemostasis ensured.The implant was placed inrightchest andimplant orientation ensured. Closure completed with 3-0 vicryl to closesuperficial fascia and ADMover implant.4-0 vicryl used to close dermis followed by 4-0 monocryl subcuticular. Implant placed inleftchest cavity.Closure completedin similar fashion.Abdomen incisions approximated with simple 4-0 monocryl stitch.Tegaderms applied to chest incisions. Dry dressing applied, followed by breastbinderand abdominal binder.  The patient was allowed to wake from anesthesia, extubated and taken to the recovery room in satisfactory condition.   SPECIMENS: none  DRAINS: none

## 2021-01-06 NOTE — Discharge Instructions (Signed)
May take Tylenol after 4pm, if needed.  May take NDSAIDS(Ibuprofen, Motrin) after 6:45 if needed.   Post Anesthesia Home Care Instructions  Activity: Get plenty of rest for the remainder of the day. A responsible individual must stay with you for 24 hours following the procedure.  For the next 24 hours, DO NOT: -Drive a car -Paediatric nurse -Drink alcoholic beverages -Take any medication unless instructed by your physician -Make any legal decisions or sign important papers.  Meals: Start with liquid foods such as gelatin or soup. Progress to regular foods as tolerated. Avoid greasy, spicy, heavy foods. If nausea and/or vomiting occur, drink only clear liquids until the nausea and/or vomiting subsides. Call your physician if vomiting continues.  Special Instructions/Symptoms: Your throat may feel dry or sore from the anesthesia or the breathing tube placed in your throat during surgery. If this causes discomfort, gargle with warm salt water. The discomfort should disappear within 24 hours.  If you had a scopolamine patch placed behind your ear for the management of post- operative nausea and/or vomiting:  1. The medication in the patch is effective for 72 hours, after which it should be removed.  Wrap patch in a tissue and discard in the trash. Wash hands thoroughly with soap and water. 2. You may remove the patch earlier than 72 hours if you experience unpleasant side effects which may include dry mouth, dizziness or visual disturbances. 3. Avoid touching the patch. Wash your hands with soap and water after contact with the patch.

## 2021-01-06 NOTE — Anesthesia Procedure Notes (Signed)
Procedure Name: Intubation Date/Time: 01/06/2021 10:50 AM Performed by: [place] Rudolf Blizard C, CRNA Pre-anesthesia Checklist: Patient identified, Emergency Drugs available, Suction available and Patient being monitored Patient Re-evaluated:Patient Re-evaluated prior to induction Oxygen Delivery Method: Circle system utilized Preoxygenation: Pre-oxygenation with 100% oxygen Induction Type: IV induction Ventilation: Mask ventilation without difficulty Laryngoscope Size: Mac and 3 Tube type: Oral Tube size: 7.0 mm Number of attempts: 1 Airway Equipment and Method: Stylet and Oral airway Placement Confirmation: ETT inserted through vocal cords under direct vision,  positive ETCO2 and breath sounds checked- equal and bilateral Tube secured with: Tape Dental Injury: Teeth and Oropharynx as per pre-operative assessment

## 2021-01-06 NOTE — Interval H&P Note (Signed)
History and Physical Interval Note:  01/06/2021 10:14 AM  Pamela [name]  has presented today for surgery, with the diagnosis of history breast cancer, acquired absence breasts, BRCA1.  The various methods of treatment have been discussed with the patient and family. After consideration of risks, benefits and other options for treatment, the patient has consented to  Procedure(s): REMOVAL OF BILATERAL TISSUE EXPANDERS WITH PLACEMENT OF BILATERAL BREAST SILICONE IMPLANTS (Bilateral) LIPOFILLING FROM ABDOMEN TO BILATERAL CHEST (Bilateral) as a surgical intervention.  The patient's history has been reviewed, patient examined, no change in status, stable for surgery.  I have reviewed the patient's chart and labs.  Questions were answered to the patient's satisfaction.     Arnoldo Hooker Emon Lance

## 2021-01-09 ENCOUNTER — Encounter (HOSPITAL_BASED_OUTPATIENT_CLINIC_OR_DEPARTMENT_OTHER): Payer: Self-pay | Admitting: Plastic Surgery

## 2021-01-09 NOTE — Anesthesia Postprocedure Evaluation (Signed)
Anesthesia Post Note  Patient: Pamela [name]  Procedure(s) Performed: REMOVAL OF BILATERAL TISSUE EXPANDERS WITH PLACEMENT OF BILATERAL BREAST SILICONE IMPLANTS (Bilateral Chest) LIPOFILLING FROM ABDOMEN TO BILATERAL CHEST (Bilateral Abdomen)     Patient location during evaluation: PACU Anesthesia Type: General Level of consciousness: awake and alert Pain management: pain level controlled Vital Signs Assessment: post-procedure vital signs reviewed and stable Respiratory status: spontaneous breathing, nonlabored ventilation and respiratory function stable Cardiovascular status: blood pressure returned to baseline and stable Postop Assessment: no apparent nausea or vomiting Anesthetic complications: no   No complications documented.  Last Vitals:  Vitals:   01/06/21 1405 01/06/21 1446  BP:  (!) 147/91  Pulse: 81 99  Resp: 15 16  Temp:  36.6 C  SpO2: 93% 98%    Last Pain:  Vitals:   01/06/21 1448  TempSrc:   PainSc: [place] Agness S[place]

## 2021-01-12 ENCOUNTER — Ambulatory Visit: Payer: BC Managed Care – PPO

## 2021-01-12 ENCOUNTER — Other Ambulatory Visit: Payer: Self-pay

## 2021-01-12 DIAGNOSIS — Z170 Estrogen receptor positive status [ER+]: Secondary | ICD-10-CM

## 2021-01-12 DIAGNOSIS — M25611 Stiffness of right shoulder, not elsewhere classified: Secondary | ICD-10-CM

## 2021-01-12 DIAGNOSIS — R293 Abnormal posture: Secondary | ICD-10-CM

## 2021-01-12 DIAGNOSIS — C50411 Malignant neoplasm of upper-outer quadrant of right female breast: Secondary | ICD-10-CM

## 2021-01-12 DIAGNOSIS — Z483 Aftercare following surgery for neoplasm: Secondary | ICD-10-CM

## 2021-01-12 DIAGNOSIS — M25612 Stiffness of left shoulder, not elsewhere classified: Secondary | ICD-10-CM

## 2021-01-12 NOTE — Therapy (Signed)
[place], [place], [address] Phone: [phone]   Fax:  [phone]  Physical Therapy Treatment  Patient Details  Name: Pamela [name] MRN: [id number] Date of Birth: Jan 23, 1976 Referring Provider (PT): Dr. Donne Hazel   Encounter Date: 01/12/2021   PT End of Session - 01/12/21 1056    Visit Number 13   number unchanged secondary to screen only   Number of Visits 17    Date for PT Re-Evaluation 01/02/21    PT Start Time 1035    PT Stop Time 1046    PT Time Calculation (min) 11 min           Past Medical History:  Diagnosis Date  . 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 Family history of BRCA1 gene positive   . Family history of breast cancer   . Family history of colon cancer   . Family history of prostate cancer   . PONV (postoperative nausea and vomiting)     Past Surgical History:  Procedure Laterality Date  . BREAST RECONSTRUCTION WITH PLACEMENT OF TISSUE EXPANDER AND ALLODERM Bilateral 10/12/2020   Procedure: BILATERAL BREAST RECONSTRUCTION WITH PLACEMENT OF TISSUE EXPANDER AND ALLODERM;  Surgeon: Irene Limbo, MD;  Location: [facility];  Service: Plastics;  Laterality: Bilateral;  . CERVICAL DISC SURGERY     ruptured disc neck with prosthetic replacement  . GANGLION CYST EXCISION Left    left wrist  . LIPOSUCTION WITH LIPOFILLING Bilateral 01/06/2021   Procedure: LIPOFILLING FROM ABDOMEN TO BILATERAL CHEST;  Surgeon: Irene Limbo, MD;  Location: [facility];  Service: Plastics;  Laterality: Bilateral;  . NIPPLE SPARING MASTECTOMY WITH SENTINEL LYMPH NODE BIOPSY Bilateral 10/12/2020   Procedure: BILATERAL NIPPLE SPARING MASTECTOMY WITH RIGHT AXILLARY SENTINEL LYMPH NODE BIOPSY;  Surgeon: Rolm Bookbinder, MD;  Location: [facility];  Service: General;  Laterality: Bilateral;  [place], RNFA  . REMOVAL OF BILATERAL TISSUE EXPANDERS WITH  PLACEMENT OF BILATERAL BREAST IMPLANTS Bilateral 01/06/2021   Procedure: REMOVAL OF BILATERAL TISSUE EXPANDERS WITH PLACEMENT OF BILATERAL BREAST SILICONE IMPLANTS;  Surgeon: Irene Limbo, MD;  Location: [facility];  Service: Plastics;  Laterality: Bilateral;  . TUBAL LIGATION      There were no vitals filed for this visit.   Subjective Assessment - 01/12/21 1055    Subjective Here for 3 month SOZO screen.  Just had an exchange done and implants.  Wearing a brace with a very small amount of metal    Pertinent History 10/12/20 bilateral nipple sparing mastectomy with R SLNB (4 nodes all negative) for treatment of R breast cancer that is ER/PR+,HER2-, BRCA 1 positive, plan is to have total hysterectomy on 02/22/21                  L-DEX FLOWSHEETS - 01/12/21 1000      L-DEX LYMPHEDEMA SCREENING   Measurement Type Unilateral    L-DEX MEASUREMENT EXTREMITY Upper Extremity    POSITION  Standing    DOMINANT SIDE Left    At Risk Side Right    BASELINE SCORE (UNILATERAL) 9.6    L-DEX SCORE (UNILATERAL) 11.6    VALUE CHANGE (UNILAT) 2                                  PT Long Term Goals - 12/05/20 1008      PT  LONG TERM GOAL #1   Title Pt will return to baseline ROM measurements and not demonstrate any signs/symptoms of lymphedema.    Time 8    Period Weeks    Status On-going      PT LONG TERM GOAL #2   Title Pt will demonstrate 170 degrees of bilateral shoulder flexion to allow pt to reach overhead.    Baseline R 101, L 75; 12/05/20- R 150, L 152    Time 4    Period Weeks    Status On-going      PT LONG TERM GOAL #3   Title Pt will demonstrate 170 degrees of bilateral shoulder abduction to allow her to reach out to the side    Baseline R 75, L 81; 12/05/20- R 122, L 111    Time 4    Period Weeks    Status On-going      PT LONG TERM GOAL #4   Title Pt will be independent in a home exercise program for continued strengthening and  stretching.    Time 4    Period Weeks    Status On-going                 Plan - 01/12/21 1056    Clinical Impression Statement pt seen for SOZO screen only.  Just had expander exchange and is wearing a brace with a very slight amount of metal.  Score well within green.  Next SOZO set up    Recommended Other Services Continue SOZO every 3 months    Consulted and Agree with Plan of Care Patient           Patient will benefit from skilled therapeutic intervention in order to improve the following deficits and impairments:     Visit Diagnosis: No diagnosis found.     Problem List Patient Active Problem List   Diagnosis Date Noted  . Breast cancer, right ([facility]) 10/12/2020  . History of cervical dysplasia 10/06/2020  . Malignant neoplasm of lower-inner quadrant of right breast of female, estrogen receptor positive ([place]) 09/16/2020  . 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 Family history of BRCA1 gene positive   . Family history of breast cancer   . Family history of prostate cancer   . Family history of colon cancer     Claris Pong 01/12/2021, 11:00 AM  [facility] [place], [place], [address] Phone: [phone]   Fax:  [phone]  Name: Pamela [name] MRN: [id number] Date of Birth: 05-14-1976 Cheral Almas, PT 01/12/21 11:00 AM

## 2021-01-16 ENCOUNTER — Ambulatory Visit: Payer: Self-pay

## 2021-01-20 ENCOUNTER — Telehealth: Payer: Self-pay | Admitting: Adult Health

## 2021-01-20 NOTE — Telephone Encounter (Signed)
R/s per 2/23 los, left message

## 2021-01-26 ENCOUNTER — Inpatient Hospital Stay: Payer: BC Managed Care – PPO | Admitting: Adult Health

## 2021-01-30 ENCOUNTER — Ambulatory Visit: Payer: BC Managed Care – PPO | Attending: General Surgery | Admitting: Physical Therapy

## 2021-01-30 ENCOUNTER — Other Ambulatory Visit: Payer: Self-pay

## 2021-01-30 ENCOUNTER — Encounter: Payer: Self-pay | Admitting: Physical Therapy

## 2021-01-30 DIAGNOSIS — R293 Abnormal posture: Secondary | ICD-10-CM | POA: Diagnosis present

## 2021-01-30 DIAGNOSIS — Z170 Estrogen receptor positive status [ER+]: Secondary | ICD-10-CM | POA: Diagnosis present

## 2021-01-30 DIAGNOSIS — M25612 Stiffness of left shoulder, not elsewhere classified: Secondary | ICD-10-CM | POA: Insufficient documentation

## 2021-01-30 DIAGNOSIS — M25611 Stiffness of right shoulder, not elsewhere classified: Secondary | ICD-10-CM | POA: Diagnosis present

## 2021-01-30 DIAGNOSIS — C50411 Malignant neoplasm of upper-outer quadrant of right female breast: Secondary | ICD-10-CM | POA: Diagnosis present

## 2021-01-30 DIAGNOSIS — Z483 Aftercare following surgery for neoplasm: Secondary | ICD-10-CM | POA: Insufficient documentation

## 2021-01-30 NOTE — Therapy (Signed)
[place], [place], [address] Phone: [phone]   Fax:  [phone]  Physical Therapy Treatment  Patient Details  Name: Pamela [name] MRN: [id number] Date of Birth: 1976-01-13 Referring Provider (PT): Dr. Donne Hazel   Encounter Date: 01/30/2021   PT End of Session - 01/30/21 1056    Visit Number 14    Number of Visits 18    Date for PT Re-Evaluation 02/27/21    PT Start Time 1008    PT Stop Time 1053    PT Time Calculation (min) 45 min    Activity Tolerance Patient tolerated treatment well    Behavior During Therapy [facility] for tasks assessed/performed           Past Medical History:  Diagnosis Date  . 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 Family history of BRCA1 gene positive   . Family history of breast cancer   . Family history of colon cancer   . Family history of prostate cancer   . PONV (postoperative nausea and vomiting)     Past Surgical History:  Procedure Laterality Date  . BREAST RECONSTRUCTION WITH PLACEMENT OF TISSUE EXPANDER AND ALLODERM Bilateral 10/12/2020   Procedure: BILATERAL BREAST RECONSTRUCTION WITH PLACEMENT OF TISSUE EXPANDER AND ALLODERM;  Surgeon: Irene Limbo, MD;  Location: [facility];  Service: Plastics;  Laterality: Bilateral;  . CERVICAL DISC SURGERY     ruptured disc neck with prosthetic replacement  . GANGLION CYST EXCISION Left    left wrist  . LIPOSUCTION WITH LIPOFILLING Bilateral 01/06/2021   Procedure: LIPOFILLING FROM ABDOMEN TO BILATERAL CHEST;  Surgeon: Irene Limbo, MD;  Location: [facility];  Service: Plastics;  Laterality: Bilateral;  . NIPPLE SPARING MASTECTOMY WITH SENTINEL LYMPH NODE BIOPSY Bilateral 10/12/2020   Procedure: BILATERAL NIPPLE SPARING MASTECTOMY WITH RIGHT AXILLARY SENTINEL LYMPH NODE BIOPSY;  Surgeon: Rolm Bookbinder, MD;  Location: [facility];  Service: General;  Laterality:  Bilateral;  [place], RNFA  . REMOVAL OF BILATERAL TISSUE EXPANDERS WITH PLACEMENT OF BILATERAL BREAST IMPLANTS Bilateral 01/06/2021   Procedure: REMOVAL OF BILATERAL TISSUE EXPANDERS WITH PLACEMENT OF BILATERAL BREAST SILICONE IMPLANTS;  Surgeon: Irene Limbo, MD;  Location: [facility];  Service: Plastics;  Laterality: Bilateral;  . TUBAL LIGATION      There were no vitals filed for this visit.   Subjective Assessment - 01/30/21 1009    Subjective Second surgery is down. I had my expanders removed and my implants placed on May 13th. I think my arms are doing pretty good.    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 No/denies    Pain Score 0-No pain              [facility] PT Assessment - 01/30/21 0001      AROM   Right Shoulder Flexion 155 Degrees    Right Shoulder ABduction 170 Degrees    Left Shoulder Flexion 165 Degrees    Left Shoulder ABduction 175 Degrees                         [facility] Adult PT Treatment/Exercise - 01/30/21 0001      Shoulder  Exercises: Standing   Extension Strengthening;Both;10 reps;Theraband   theraband over top of door   Theraband Level (Shoulder Extension) Level 2 (Red)      Manual Therapy   Scapular Mobilization In Lt S/L to Rt scapula into protraction and retraction with no limitiations in motion noted today    Passive ROM In Supine to bilateral shouldesr into flexion and abduction with focus on flexion - pt more limited on R due to impingement like pain in R upper shoulder- depressing R scapula only helped minimally                       PT Long Term Goals - 01/30/21 1010      PT LONG TERM GOAL #1   Title Pt will return to baseline ROM measurements and not  demonstrate any signs/symptoms of lymphedema.    Time 8    Period Weeks    Status On-going      PT LONG TERM GOAL #2   Title Pt will demonstrate 170 degrees of bilateral shoulder flexion to allow pt to reach overhead.    Baseline R 101, L 75; 12/05/20- R 150, L 152; 01/30/21- R 155, L 165    Time 4    Period Weeks    Status On-going      PT LONG TERM GOAL #3   Title Pt will demonstrate 170 degrees of bilateral shoulder abduction to allow her to reach out to the side    Baseline R 75, L 81; 12/05/20- R 122, L 111; 01/30/21- R 170, L 175    Time 4    Period Weeks    Status Achieved      PT LONG TERM GOAL #4   Title Pt will be independent in a home exercise program for continued strengthening and stretching.    Time 4    Period Weeks    Status On-going                 Plan - 01/30/21 1217    Clinical Impression Statement Pt returns to PT after undergoing expander exchange for implants on 01/06/21. Pt reports she feels her ROM is doing pretty good. Reassessed goals and pt has now met her abduction ROM goals bilaterally. She is still limited with shoulder flexion though it is improving. Pt has pinching pain in top of right shoulder with end range flexion on the right only. Pt would benefit from skilled PT services to improve bilateral flexion ROM and to decrease pain in top of R shoulder at end range. She may benefit from ionto to help with this.    PT Frequency 1x / week    PT Duration 4 weeks    PT Treatment/Interventions ADLs/Self Care Home Management;Patient/family education;Therapeutic exercise;Manual techniques;Passive range of motion;Iontophoresis 17m/ml Dexamethasone    PT Next Visit Plan try ionto if cert signed, bilateral flexion PROM, continue L dex screens    PT Home Exercise Plan post op shoulder ROM; supine over towel roll for bil UE's and scapular mobility; supine scapular series with yellow theraband; bil UE 3 way raises           Patient will benefit from skilled  therapeutic intervention in order to improve the following deficits and impairments:  Postural dysfunction,Decreased knowledge of precautions,Impaired UE functional use,Impaired flexibility,Increased fascial restricitons,Decreased strength,Decreased range of motion  Visit Diagnosis: Stiffness of right shoulder, not elsewhere classified  Stiffness of left shoulder, not elsewhere classified  Aftercare following surgery for neoplasm  Abnormal posture  Malignant neoplasm of upper-outer quadrant of right breast in female, estrogen receptor positive [facility])     Problem List Patient Active Problem List   Diagnosis Date Noted  . Breast cancer, right ([place]) 10/12/2020  . History of cervical dysplasia 10/06/2020  . Malignant neoplasm of lower-inner quadrant of right breast of female, estrogen receptor positive ([place]) 09/16/2020  . 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 Family history of BRCA1 gene positive   . Family history of breast cancer   . Family history of prostate cancer   . Family history of colon cancer     Pamela [name] [facility] 01/30/2021, 12:21 PM  [place], [place], [address] Phone: [phone]   Fax:  [phone]  Name: KADYNCE BONDS MRN: [id number] Date of Birth: April 03, 1976  Manus Gunning, PT 01/30/21 12:21 PM

## 2021-01-30 NOTE — Patient Instructions (Signed)
Shoulder Extension/ Lat Strengthening Exercise  Tie knot in theraband and throw over door, close door. Hold band in right hand and pull straight back keeping elbow extended. Do 10x, 2x/day. Then repeat on L side.

## 2021-02-02 ENCOUNTER — Telehealth: Payer: Self-pay | Admitting: Oncology

## 2021-02-02 NOTE — Telephone Encounter (Signed)
Charman called back and wants to go forward with surgery on 02/22/21.  Also reviewed upcoming pre op appointment with Joylene John, NP on 02/17/21.

## 2021-02-02 NOTE — Telephone Encounter (Signed)
Left a message for Tonette regarding surgery on 02/22/21.  Requested a return call.

## 2021-02-06 ENCOUNTER — Encounter: Payer: Self-pay | Admitting: Physical Therapy

## 2021-02-06 ENCOUNTER — Other Ambulatory Visit: Payer: Self-pay

## 2021-02-06 ENCOUNTER — Ambulatory Visit: Payer: BC Managed Care – PPO | Admitting: Physical Therapy

## 2021-02-06 DIAGNOSIS — M25611 Stiffness of right shoulder, not elsewhere classified: Secondary | ICD-10-CM

## 2021-02-06 DIAGNOSIS — R293 Abnormal posture: Secondary | ICD-10-CM

## 2021-02-06 DIAGNOSIS — Z483 Aftercare following surgery for neoplasm: Secondary | ICD-10-CM

## 2021-02-06 NOTE — Therapy (Signed)
[place], [place], [address] Phone: [phone]   Fax:  [phone]  Physical Therapy Treatment  Patient Details  Name: Pamela [name] MRN: [id number] Date of Birth: Dec 31, 1975 Referring Provider (PT): Dr. Donne Hazel   Encounter Date: 02/06/2021   PT End of Session - 02/06/21 1057     Visit Number 15    Number of Visits 18    Date for PT Re-Evaluation 02/27/21    PT Start Time 1007    PT Stop Time 1055    PT Time Calculation (min) 48 min    Activity Tolerance Patient tolerated treatment well    Behavior During Therapy WFL for tasks assessed/performed             Past Medical History:  Diagnosis Date   BRCA1 gene mutation positive in female    Family history of BRCA1 gene positive    Family history of breast cancer    Family history of colon cancer    Family history of prostate cancer    PONV (postoperative nausea and vomiting)     Past Surgical History:  Procedure Laterality Date   BREAST RECONSTRUCTION WITH PLACEMENT OF TISSUE EXPANDER AND ALLODERM Bilateral 10/12/2020   Procedure: BILATERAL BREAST RECONSTRUCTION WITH PLACEMENT OF TISSUE EXPANDER AND ALLODERM;  Surgeon: Irene Limbo, MD;  Location: [facility];  Service: Plastics;  Laterality: Bilateral;   CERVICAL DISC SURGERY     ruptured disc neck with prosthetic replacement   GANGLION CYST EXCISION Left    left wrist   LIPOSUCTION WITH LIPOFILLING Bilateral 01/06/2021   Procedure: LIPOFILLING FROM ABDOMEN TO BILATERAL CHEST;  Surgeon: Irene Limbo, MD;  Location: [facility];  Service: Plastics;  Laterality: Bilateral;   NIPPLE SPARING MASTECTOMY WITH SENTINEL LYMPH NODE BIOPSY Bilateral 10/12/2020   Procedure: BILATERAL NIPPLE SPARING MASTECTOMY WITH RIGHT AXILLARY SENTINEL LYMPH NODE BIOPSY;  Surgeon: Rolm Bookbinder, MD;  Location: [facility];  Service: General;  Laterality:  Bilateral;  [place], RNFA   REMOVAL OF BILATERAL TISSUE EXPANDERS WITH PLACEMENT OF BILATERAL BREAST IMPLANTS Bilateral 01/06/2021   Procedure: REMOVAL OF BILATERAL TISSUE EXPANDERS WITH PLACEMENT OF BILATERAL BREAST SILICONE IMPLANTS;  Surgeon: Irene Limbo, MD;  Location: [facility];  Service: Plastics;  Laterality: Bilateral;   TUBAL LIGATION      There were no vitals filed for this visit.   Subjective Assessment - 02/06/21 1008     Subjective My shoulder is about the same. I am hoping the ionto patch helps.    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 No/denies    Pain Score 0-No pain                [facility] PT Assessment - 02/06/21 0001       AROM   Right Shoulder Flexion 154 Degrees                           [facility] Adult PT Treatment/Exercise - 02/06/21 0001       Manual Therapy   Manual Therapy Joint mobilization    Joint Mobilization to R shoulder with grade 1-2 in posterior and inferior directions  Soft tissue mobilization to medial and lateral scapular borders with 1 trigger pointed along scapular border    Scapular Mobilization In Lt S/L to Rt scapula into protraction and retraction with no limitiations in motion noted today    Passive ROM in supine to R shoulder in to flexion with downward pressure on scapula to decrease impingment                         PT Long Term Goals - 01/30/21 1010       PT LONG TERM GOAL #1   Title Pt will return to baseline ROM measurements and not demonstrate any signs/symptoms of lymphedema.    Time 8    Period Weeks    Status On-going      PT LONG TERM GOAL #2   Title Pt will demonstrate 170 degrees of bilateral shoulder flexion to  allow pt to reach overhead.    Baseline R 101, L 75; 12/05/20- R 150, L 152; 01/30/21- R 155, L 165    Time 4    Period Weeks    Status On-going      PT LONG TERM GOAL #3   Title Pt will demonstrate 170 degrees of bilateral shoulder abduction to allow her to reach out to the side    Baseline R 75, L 81; 12/05/20- R 122, L 111; 01/30/21- R 170, L 175    Time 4    Period Weeks    Status Achieved      PT LONG TERM GOAL #4   Title Pt will be independent in a home exercise program for continued strengthening and stretching.    Time 4    Period Weeks    Status On-going                   Plan - 02/06/21 1059     Clinical Impression Statement Pt has been doing her exercises over the weekend incluidng her new exercise given at last session but is still limited with R shoulder flexion and having pain at end range with R shoulder flexion. Applied ionto patch today at end of session and educated pt to remove after 4-6 hrs (sooner if any pain, redness or burning). Continued with manual therapy to trigger points surrounding scapula and scapular mobilization. Added joint mobilization to R shoulder to help decrease impingement. Will assess how the ionto patch worked at next session.    PT Frequency 1x / week    PT Duration 4 weeks    PT Treatment/Interventions ADLs/Self Care Home Management;Patient/family education;Therapeutic exercise;Manual techniques;Passive range of motion;Iontophoresis 71m/ml Dexamethasone    PT Next Visit Plan see how iotno worked and cont as needed, STM to trigger point where pt has pain at end range R shoulder ROM,  bilateral flexion PROM, continue L dex screens    PT Home Exercise Plan post op shoulder ROM; supine over towel roll for bil UE's and scapular mobility; supine scapular series with yellow theraband; bil UE 3 way raises    Consulted and Agree with Plan of Care Patient             Patient will benefit from skilled therapeutic intervention in order to improve  the following deficits and impairments:  Postural dysfunction, Decreased knowledge of precautions, Impaired UE functional use, Impaired flexibility, Increased fascial restricitons, Decreased strength, Decreased range of motion  Visit Diagnosis: Stiffness of right shoulder, not elsewhere classified  Aftercare following surgery for neoplasm  Abnormal posture     Problem List Patient Active Problem List   Diagnosis Date Noted   Breast cancer, right ([place]) 10/12/2020   History of cervical dysplasia 10/06/2020   Malignant neoplasm of lower-inner quadrant of right breast of female, estrogen receptor positive ([place]) 09/16/2020   BRCA1 gene mutation positive in female    Family history of BRCA1 gene positive    Family history of breast cancer    Family history of prostate cancer    Family history of colon cancer     Allyson Sabal [facility] 02/06/2021, 11:04 AM  [place], [place], [address] Phone: [phone]   Fax:  [phone]  Name: JANNAT ROSEMEYER MRN: [id number] Date of Birth: 12-06-1975   Manus Gunning, PT 02/06/21 11:04 AM

## 2021-02-07 ENCOUNTER — Encounter: Payer: Self-pay | Admitting: Licensed Clinical Social Worker

## 2021-02-07 NOTE — Progress Notes (Signed)
[facility] CSW Progress Note  Clinical Social Worker was referred by PT for emotional support. CSW contacted patient by phone to assess current needs. Patient is struggling with adjustment after surgery and is interested in speaking with someone else who experienced similar surgeries. CSW referred for [facility] and signed patient up for support group with patient's consent. Patient also expressed interest in financial assistance due to gas costs going to PT. CSW sent information on [facility] in addition to ongoing therapists to help support her mental health.    Christeen Douglas , LCSW

## 2021-02-08 ENCOUNTER — Encounter: Payer: Self-pay | Admitting: Physical Therapy

## 2021-02-09 ENCOUNTER — Telehealth: Payer: Self-pay | Admitting: Adult Health

## 2021-02-09 ENCOUNTER — Encounter: Payer: Self-pay | Admitting: Licensed Clinical Social Worker

## 2021-02-09 NOTE — Patient Instructions (Signed)
Preparing for your Surgery  Plan for surgery on February 22, 2021 with Dr. Everitt Amber at [facility]. You will be scheduled for a robotic assisted total laparoscopic hysterectomy (removal of the uterus and cervix), bilateral salpingo-oophorectomy (removal of both ovaries and fallopian tubes).  You will need to stop taking your tamoxifen for one week before surgery and one week after surgery.   Pre-operative Testing -You will receive a phone call from presurgical testing at [facility] to arrange for a pre-operative appointment if needed/discuss instructions, and arrange for lab work.  -Bring your insurance card, copy of an advanced directive if applicable, medication list  -At that visit, you will be asked to sign a consent for a possible blood transfusion in case a transfusion becomes necessary during surgery.  The need for a blood transfusion is rare but having consent is a necessary part of your care.     -You should not be taking blood thinners or aspirin at least ten days prior to surgery unless instructed by your surgeon.  -Do not take supplements such as fish oil (omega 3), red yeast rice, turmeric before your surgery. You want to avoid medications with aspirin in them including headache powders such as BC or Goody's), Excedrin migraine.  Day Before Surgery at [place] will be asked to take in a light diet the day before surgery. You will be advised you can have clear liquids up until 3 hours before your surgery.    Eat a light diet the day before surgery.  Examples including soups, broths, toast, yogurt, mashed potatoes.  AVOID GAS PRODUCING FOODS. Things to avoid include carbonated beverages (fizzy beverages, sodas), raw fruits and raw vegetables (uncooked), or beans.   If your bowels are filled with gas, your surgeon will have difficulty visualizing your pelvic organs which increases your surgical risks.  Your role in recovery Your  role is to become active as soon as directed by your doctor, while still giving yourself time to heal.  Rest when you feel tired. You will be asked to do the following in order to speed your recovery:  - Cough and breathe deeply. This helps to clear and expand your lungs and can prevent pneumonia after surgery.  - [place]. Do mild physical activity. Walking or moving your legs help your circulation and body functions return to normal. Do not try to get up or walk alone the first time after surgery.   -If you develop swelling on one leg or the other, pain in the back of your leg, redness/warmth in one of your legs, please call the office or go to the Emergency Room to have a doppler to rule out a blood clot. For shortness of breath, chest pain-seek care in the Emergency Room as soon as possible. - Actively manage your pain. Managing your pain lets you move in comfort. We will ask you to rate your pain on a scale of zero to 10. It is your responsibility to tell your doctor or nurse where and how much you hurt so your pain can be treated.  Special Considerations -If you are diabetic, you may be placed on insulin after surgery to have closer control over your blood sugars to promote healing and recovery.  This does not mean that you will be discharged on insulin.  If applicable, your oral antidiabetics will be resumed when you are tolerating a solid diet.  -Your final pathology results from surgery  should be available around one week after surgery and the results will be relayed to you when available.  -FMLA forms can be faxed to [phone] and please allow 5-7 business days for completion.  Pain Management After Surgery -You have been prescribed your pain medication and bowel regimen medications before surgery so that you can have these available when you are discharged from the hospital. The pain medication is for use ONLY AFTER surgery and a new prescription will not be given.    -Make sure that you have Tylenol and Ibuprofen at home to use on a regular basis after surgery for pain control. We recommend alternating the medications every hour to six hours since they work differently and are processed in the body differently for pain relief.  -Review the attached handout on narcotic use and their risks and side effects.   Bowel Regimen -You have been prescribed Sennakot-S to take nightly to prevent constipation especially if you are taking the narcotic pain medication intermittently.  It is important to prevent constipation and drink adequate amounts of liquids. You can stop taking this medication when you are not taking pain medication and you are back on your normal bowel routine.  Risks of Surgery Risks of surgery are low but include bleeding, infection, damage to surrounding structures, re-operation, blood clots, and very rarely death.   Blood Transfusion Information (For the consent to be signed before surgery)  We will be checking your blood type before surgery so in case of emergencies, we will know what type of blood you would need.                                            WHAT IS A BLOOD TRANSFUSION?  A transfusion is the replacement of blood or some of its parts. Blood is made up of multiple cells which provide different functions. Red blood cells carry oxygen and are used for blood loss replacement. White blood cells fight against infection. Platelets control bleeding. Plasma helps clot blood. Other blood products are available for specialized needs, such as hemophilia or other clotting disorders. BEFORE THE TRANSFUSION  Who gives blood for transfusions?  You may be able to donate blood to be used at a later date on yourself (autologous donation). Relatives can be asked to donate blood. This is generally not any safer than if you have received blood from a stranger. The same precautions are taken to ensure safety when a relative's blood is  donated. Healthy volunteers who are fully evaluated to make sure their blood is safe. This is blood bank blood. Transfusion therapy is the safest it has ever been in the practice of medicine. Before blood is taken from a donor, a complete history is taken to make sure that person has no history of diseases nor engages in risky social behavior (examples are intravenous drug use or sexual activity with multiple partners). The donor's travel history is screened to minimize risk of transmitting infections, such as malaria. The donated blood is tested for signs of infectious diseases, such as HIV and hepatitis. The blood is then tested to be sure it is compatible with you in order to minimize the chance of a transfusion reaction. If you or a relative donates blood, this is often done in anticipation of surgery and is not appropriate for emergency situations. It takes many days to process the donated blood. RISKS  AND COMPLICATIONS Although transfusion therapy is very safe and saves many lives, the main dangers of transfusion include:  Getting an infectious disease. Developing a transfusion reaction. This is an allergic reaction to something in the blood you were given. Every precaution is taken to prevent this. The decision to have a blood transfusion has been considered carefully by your caregiver before blood is given. Blood is not given unless the benefits outweigh the risks.  AFTER SURGERY INSTRUCTIONS  Return to work: 4 weeks if applicable  Activity: 1. Be up and out of the bed during the day.  Take a nap if needed.  You may walk up steps but be careful and use the hand rail.  Stair climbing will tire you more than you think, you may need to stop part way and rest.   2. No lifting or straining for 6 weeks over 10 pounds. No pushing, pulling, straining for 6 weeks.  3. No driving for 1 week(s).  Do not drive if you are taking narcotic pain medicine and make sure that your reaction time has returned.    4. You can shower as soon as the next day after surgery. Shower daily.  Use your regular soap and water (not directly on the incision) and pat your incision(s) dry afterwards; don't rub.  No tub baths or submerging your body in water until cleared by your surgeon. If you have the soap that was given to you by pre-surgical testing that was used before surgery, you do not need to use it afterwards because this can irritate your incisions.   5. No sexual activity and nothing in the vagina for 8 weeks.  6. You may experience a small amount of clear drainage from your incisions, which is normal.  If the drainage persists, increases, or changes color please call the office.  7. Do not use creams, lotions, or ointments such as neosporin on your incisions after surgery until advised by your surgeon because they can cause removal of the dermabond glue on your incisions.    8. You may experience vaginal spotting after surgery or around the 6-8 week mark from surgery when the stitches at the top of the vagina begin to dissolve.  The spotting is normal but if you experience heavy bleeding, call our office.  9. Take Tylenol or ibuprofen first for pain and only use narcotic pain medication for severe pain not relieved by the Tylenol or Ibuprofen.  Monitor your Tylenol intake to a max of 4,000 mg in a 24 hour period. You can alternate these medications after surgery.  Diet: 1. Low sodium Heart Healthy Diet is recommended but you are cleared to resume your normal (before surgery) diet after your procedure.  2. It is safe to use a laxative, such as Miralax or Colace, if you have difficulty moving your bowels. You have been prescribed Sennakot at bedtime every evening to keep bowel movements regular and to prevent constipation.    Wound Care: 1. Keep clean and dry.  Shower daily.  Reasons to call the Doctor: Fever - Oral temperature greater than 100.4 degrees Fahrenheit Foul-smelling vaginal  discharge Difficulty urinating Nausea and vomiting Increased pain at the site of the incision that is unrelieved with pain medicine. Difficulty breathing with or without chest pain New calf pain especially if only on one side Sudden, continuing increased vaginal bleeding with or without clots.   Contacts: For questions or concerns you should contact:  Dr. Everitt Amber at [place], NP  at [phone]  After Hours: call [phone] and have the GYN Oncologist paged/contacted (after 5 pm or on the weekends).  Messages sent via mychart are for non-urgent matters and are not responded to after hours so for urgent needs, please call the after hours number.

## 2021-02-09 NOTE — Progress Notes (Signed)
Patient here for a pre-operative appointment prior to her scheduled surgery on February 22, 2021. She is scheduled for a robotic assisted total laparoscopic hysterectomy, bilateral salpingo-oophorectomy for BRCA 1 mutation. The surgery was discussed in detail.  See after visit summary for additional details. Visual aids used to discuss items related to surgery including sequential compression stockings, foley catheter, IV pump, multi-modal pain regimen including tylenol, photo of the surgical robot, female reproductive system to discuss surgery in detail.      Discussed post-op pain management in detail including the aspects of the enhanced recovery pathway.  Advised her that a new prescription would be sent in for oxycodone and it is only to be used for after her upcoming surgery.  We discussed the use of tylenol post-op and to monitor for a maximum of 4,000 mg in a 24 hour period. She will continue use of her home bowel regimen consisting of daily Miralax. She reports post-operative nausea and has used zofran in the past as well. Discussed the side effect of constipation with zofran.   Discussed the use of lovenox pre-op, SCDs, and measures to take at home to prevent DVT including frequent mobility.  Reportable signs and symptoms of DVT discussed. Post-operative instructions discussed and expectations for after surgery. Incisional care discussed as well including reportable signs and symptoms including erythema, drainage, wound separation.     10 minutes spent with the patient.  Verbalizing understanding of material discussed. No needs or concerns voiced at the end of the visit.   Advised patient to call for any needs.  Advised that her post-operative medications had been prescribed and could be picked up at any time.     This visit is included in the global surgical bundle and has no charge for this visit.

## 2021-02-09 NOTE — Telephone Encounter (Signed)
Patient called in to r/s 6/20 appt. Confirmed new date and time

## 2021-02-09 NOTE — Progress Notes (Signed)
[facility] CSW Progress Note  Clinical Education officer, museum received completed [employer] application from patient. Submitted with supporting letter to [facility] who will contact patient directly regarding decision on funding.    Christeen Douglas , LCSW

## 2021-02-13 ENCOUNTER — Telehealth: Payer: Self-pay

## 2021-02-13 ENCOUNTER — Inpatient Hospital Stay: Payer: BC Managed Care – PPO | Admitting: Adult Health

## 2021-02-13 NOTE — Telephone Encounter (Signed)
Told Ms Wolgamott that she needs to hold her tamoxifen for 1 week pre surgery and 1 week post surgery.  Her surgery is 02-22-21.  Her last dose of tamoxifen is tomorrow 02-14-21 per Melissa Cross,NP. Pt verbalized understanding.

## 2021-02-16 ENCOUNTER — Encounter: Payer: Self-pay | Admitting: Gynecologic Oncology

## 2021-02-16 ENCOUNTER — Encounter (HOSPITAL_BASED_OUTPATIENT_CLINIC_OR_DEPARTMENT_OTHER): Payer: Self-pay | Admitting: Gynecologic Oncology

## 2021-02-16 ENCOUNTER — Other Ambulatory Visit: Payer: Self-pay

## 2021-02-16 NOTE — Progress Notes (Signed)
Spoke w/ via phone for pre-op interview---pt Lab needs dos----  urine preg             Lab results------has lab appt 02-20-2021 for 830 or cbc bmp t & s  COVID test -----02-20-2021 935 Arrive at -------530 am 02-22-2021  Light diet day before NPO after MN NO Solid Food.  Clear liquids from MN until---430 am then npo Med rec completed Medications to take morning of surgery -----prilosec Diabetic medication -----n/a Patient instructed no nail polish to be worn day of surgery Patient instructed to bring photo id and insurance card day of surgery Patient aware to have Driver (ride ) / caregiver   spouse Pamela [name] will stay  for 24 hours after surgery  Patient Special Instructions -----light day before surgery Pre-Op special Istructions -----use plain dial soap shower hs and am of surgery  (allergic to hibiclens ) Patient verbalized understanding of instructions that were given at this phone interview. Patient denies shortness of breath, chest pain, fever, cough at this phone interview.

## 2021-02-16 NOTE — Progress Notes (Signed)
YOU ARE SCHEDULED FOR A COVID TEST ON  02-22-2021 935 AM.  THIS TEST MUST BE DONE BEFORE SURGERY. GO TO  [place]. [place], [place], IT IS APPROXIMATELY 2 MINUTES PAST [employer] ON THE RIGHT AND REMAIN IN YOUR CAR, THIS IS A DRIVE UP TEST.       Your procedure is scheduled on 02-22-2021  Report to [place] [facility].   Call this number if you have problems the morning of surgery  [phone].   OUR ADDRESS IS [place].  WE ARE LOCATED IN [facility].  PLEASE BRING YOUR INSURANCE CARD AND PHOTO ID DAY OF SURGERY.  ONLY ONE PERSON ALLOWED IN FACILITY WAITING AREA.                                     REMEMBER:  LIGHT DIET DAY BEFORE SURGERY  Eat a light diet the day before surgery.  Examples including soups, broths, toast, yogurt, mashed potatoes.  Things to avoid include carbonated beverages (fizzy beverages), raw fruits and raw vegetables, or beans.   If your bowels are filled with gas, your surgeon will have difficulty visualizing your pelvic organs which increases your surgical risks.    DO NOT EAT FOOD, CANDY GUM OR MINTS  AFTER MIDNIGHT . YOU MAY HAVE CLEAR LIQUIDS FROM MIDNIGHT UNTIL 430 AM. NO CLEAR LIQUIDS AFTER 430 AM DAY OF SURGERY.   YOU MAY  BRUSH YOUR TEETH MORNING OF SURGERY AND RINSE YOUR MOUTH OUT, NO CHEWING GUM CANDY OR MINTS.    CLEAR LIQUID DIET   Foods Allowed                                                                     Foods Excluded  Coffee and tea, regular and decaf                             liquids that you cannot  Plain Jell-O any favor except red or purple                                           see through such as: Fruit ices (not with fruit pulp)                                     milk, soups, orange juice  Iced Popsicles                                    All solid                                   Cranberry, grape and apple juices Sports drinks like Gatorade Lightly  seasoned clear broth or consume(fat free) Sugar, honey  syrup  Sample Menu Breakfast                                Lunch                                     Supper Cranberry juice                    Beef broth                            Chicken broth Jell-O                                     Grape juice                           Apple juice Coffee or tea                        Jell-O                                      Popsicle                                                Coffee or tea                        Coffee or tea  _____________________________________________________________________    SHOWER WITH PLAIN DIAL SOAP NIGHT BEFORE AND MORNING OF SURGERY SINCE YOU ARE ALLERGIC TO HIBICLENS.   TAKE THESE MEDICATIONS MORNING OF SURGERY WITH A SIP OF WATER:  PRILOSEC  ONE VISITOR IS ALLOWED IN WAITING ROOM ONLY DAY OF SURGERY.  NO VISITOR MAY SPEND THE NIGHT.  VISITOR ARE ALLOWED TO STAY UNTIL 800 PM.                                    DO NOT WEAR JEWERLY, MAKE UP. DO NOT WEAR LOTIONS, POWDERS, PERFUMES OR NAIL POLISH. DO NOT SHAVE FOR 24 HOURS PRIOR TO DAY OF SURGERY. MEN MAY SHAVE FACE AND NECK. CONTACTS, GLASSES, OR DENTURES MAY NOT BE WORN TO SURGERY.                                    [facility] IS NOT RESPONSIBLE  FOR ANY BELONGINGS.                                                                    Marland Kitchen            FAILURE  TO FOLLOW THESE INSTRUCTIONS MAY RESULT IN THE CANCELLATION OF YOUR SURGERY PATIENT SIGNATURE_________________________________  NURSE SIGNATURE__________________________________  ________________________________________________________________________                                                        QUESTIONS Hansel Feinstein PRE OP NURSE PHONE [phone].

## 2021-02-17 ENCOUNTER — Encounter: Payer: Self-pay | Admitting: Adult Health

## 2021-02-17 ENCOUNTER — Inpatient Hospital Stay (HOSPITAL_BASED_OUTPATIENT_CLINIC_OR_DEPARTMENT_OTHER): Payer: BC Managed Care – PPO | Admitting: Adult Health

## 2021-02-17 ENCOUNTER — Ambulatory Visit: Payer: BC Managed Care – PPO | Admitting: Rehabilitation

## 2021-02-17 ENCOUNTER — Encounter: Payer: Self-pay | Admitting: Rehabilitation

## 2021-02-17 ENCOUNTER — Inpatient Hospital Stay: Payer: BC Managed Care – PPO | Attending: Gynecologic Oncology | Admitting: Gynecologic Oncology

## 2021-02-17 ENCOUNTER — Telehealth: Payer: Self-pay | Admitting: *Deleted

## 2021-02-17 ENCOUNTER — Other Ambulatory Visit: Payer: Self-pay

## 2021-02-17 VITALS — BP 119/74 | HR 82 | Temp 97.6°F | Resp 18 | Ht 67.0 in | Wt 153.6 lb

## 2021-02-17 DIAGNOSIS — Z170 Estrogen receptor positive status [ER+]: Secondary | ICD-10-CM | POA: Insufficient documentation

## 2021-02-17 DIAGNOSIS — Z1501 Genetic susceptibility to malignant neoplasm of breast: Secondary | ICD-10-CM | POA: Diagnosis not present

## 2021-02-17 DIAGNOSIS — M25611 Stiffness of right shoulder, not elsewhere classified: Secondary | ICD-10-CM

## 2021-02-17 DIAGNOSIS — Z483 Aftercare following surgery for neoplasm: Secondary | ICD-10-CM

## 2021-02-17 DIAGNOSIS — Z9013 Acquired absence of bilateral breasts and nipples: Secondary | ICD-10-CM | POA: Diagnosis not present

## 2021-02-17 DIAGNOSIS — C50311 Malignant neoplasm of lower-inner quadrant of right female breast: Secondary | ICD-10-CM | POA: Insufficient documentation

## 2021-02-17 DIAGNOSIS — R293 Abnormal posture: Secondary | ICD-10-CM

## 2021-02-17 DIAGNOSIS — M25612 Stiffness of left shoulder, not elsewhere classified: Secondary | ICD-10-CM

## 2021-02-17 DIAGNOSIS — Z1509 Genetic susceptibility to other malignant neoplasm: Secondary | ICD-10-CM

## 2021-02-17 DIAGNOSIS — Z148 Genetic carrier of other disease: Secondary | ICD-10-CM

## 2021-02-17 DIAGNOSIS — Z1502 Genetic susceptibility to malignant neoplasm of ovary: Secondary | ICD-10-CM | POA: Diagnosis not present

## 2021-02-17 MED ORDER — OXYCODONE HCL 5 MG PO TABS: 5.0000 mg | ORAL_TABLET | ORAL | 0 refills | Status: DC | PRN

## 2021-02-17 MED ORDER — ONDANSETRON HCL 8 MG PO TABS
8.0000 mg | ORAL_TABLET | Freq: Three times a day (TID) | ORAL | 0 refills | Status: AC | PRN
Start: 2021-02-17 — End: ?

## 2021-02-17 MED ORDER — IBUPROFEN 800 MG PO TABS: 800.0000 mg | ORAL_TABLET | Freq: Three times a day (TID) | ORAL | 0 refills | Status: AC | PRN

## 2021-02-17 NOTE — Therapy (Signed)
[place], [place], [address] Phone: [phone]   Fax:  [phone]  Physical Therapy Treatment  Patient Details  Name: Pamela [name] MRN: [id number] Date of Birth: 07-10-1976 Referring Provider (PT): Dr. Donne Hazel   Encounter Date: 02/17/2021   PT End of Session - 02/17/21 0949     Visit Number 16    Number of Visits 18    Date for PT Re-Evaluation 02/27/21    PT Start Time 0902    PT Stop Time 0947    PT Time Calculation (min) 45 min    Activity Tolerance Patient tolerated treatment well    Behavior During Therapy [facility] for tasks assessed/performed             Past Medical History:  Diagnosis Date   BRCA1 gene mutation positive in female    Cancer [facility]) 2021   right   Family history of BRCA1 gene positive    Family history of breast cancer    Family history of colon cancer    Family history of prostate cancer    GERD (gastroesophageal reflux disease)    Pneumonia    2015 or 2016   PONV (postoperative nausea and vomiting)    Wears glasses    for reading    Past Surgical History:  Procedure Laterality Date   BREAST RECONSTRUCTION WITH PLACEMENT OF TISSUE EXPANDER AND ALLODERM Bilateral 10/12/2020   Procedure: BILATERAL BREAST RECONSTRUCTION WITH PLACEMENT OF TISSUE EXPANDER AND ALLODERM;  Surgeon: Irene Limbo, MD;  Location: [place];  Service: Plastics;  Laterality: Bilateral;   CERVICAL DISC SURGERY  2017   ruptured disc neck with prosthetic replacement   GANGLION CYST EXCISION Left 2019   left wrist yrs   LIPOSUCTION WITH LIPOFILLING Bilateral 01/06/2021   Procedure: LIPOFILLING FROM ABDOMEN TO BILATERAL CHEST;  Surgeon: Irene Limbo, MD;  Location: [place];  Service: Plastics;  Laterality: Bilateral;   NIPPLE SPARING MASTECTOMY WITH SENTINEL LYMPH NODE BIOPSY Bilateral 10/12/2020   Procedure: BILATERAL NIPPLE SPARING MASTECTOMY WITH  RIGHT AXILLARY SENTINEL LYMPH NODE BIOPSY;  Surgeon: Rolm Bookbinder, MD;  Location: [place];  Service: General;  Laterality: Bilateral;  PEC BLOCK, RNFA   REMOVAL OF BILATERAL TISSUE EXPANDERS WITH PLACEMENT OF BILATERAL BREAST IMPLANTS Bilateral 01/06/2021   Procedure: REMOVAL OF BILATERAL TISSUE EXPANDERS WITH PLACEMENT OF BILATERAL BREAST SILICONE IMPLANTS;  Surgeon: Irene Limbo, MD;  Location: [place];  Service: Plastics;  Laterality: Bilateral;   TUBAL LIGATION     yrs ago    There were no vitals filed for this visit.   Subjective Assessment - 02/17/21 0903     Subjective I felt a diference the first few days.  I dont' feel it pinch during a normal day    Pertinent History 10/12/20 bilateral nipple sparing mastectomy with R SLNB (4 nodes all negative) for treatment of R breast cancer that is ER/PR+,HER2-, BRCA 1 positive, plan is to have total hysterectomy on 02/22/21    Patient Stated Goals to gain info from provider    Currently in Pain? No/denies                               [facility] Adult PT Treatment/Exercise - 02/17/21 0001       Exercises   Exercises Other Exercises    Other Exercises  serratus wall roll ups red band x 15, row  into ER red x 15, snow angel x 15 red band all without pinch      Modalities   Modalities Iontophoresis      Iontophoresis   Type of Iontophoresis Dexamethasone    Location Rt posterior shoulder    Time 6hr wear patch      Manual Therapy   Joint Mobilization various positions for inferior and AP glides grade IV- overall, arm at 90deg abduction, arm at 90/90, and at end range flexion and abduction prior to P1    Passive ROM in supine to R shoulder in to flexion with downward pressure on scapula to decrease impingment                         PT Long Term Goals - 01/30/21 1010       PT LONG TERM GOAL #1   Title Pt will return to baseline ROM measurements and not  demonstrate any signs/symptoms of lymphedema.    Time 8    Period Weeks    Status On-going      PT LONG TERM GOAL #2   Title Pt will demonstrate 170 degrees of bilateral shoulder flexion to allow pt to reach overhead.    Baseline R 101, L 75; 12/05/20- R 150, L 152; 01/30/21- R 155, L 165    Time 4    Period Weeks    Status On-going      PT LONG TERM GOAL #3   Title Pt will demonstrate 170 degrees of bilateral shoulder abduction to allow her to reach out to the side    Baseline R 75, L 81; 12/05/20- R 122, L 111; 01/30/21- R 170, L 175    Time 4    Period Weeks    Status Achieved      PT LONG TERM GOAL #4   Title Pt will be independent in a home exercise program for continued strengthening and stretching.    Time 4    Period Weeks    Status On-going                   Plan - 02/17/21 0949     Clinical Impression Statement Pt noting improvements since ionto patch application.  Unable to reproduce pinch today but did still apply one more patch in the same location for maximal effect.  No impingement noted with PROM or TE.  Pt may be already ready for DC if improvements continue    PT Frequency 1x / week    PT Duration 4 weeks    PT Treatment/Interventions ADLs/Self Care Home Management;Patient/family education;Therapeutic exercise;Manual techniques;Passive range of motion;Iontophoresis 75m/ml Dexamethasone    PT Next Visit Plan cont ionto as needed, impingement TE, joint mob AP and inferior, continue L dex screens    Consulted and Agree with Plan of Care Patient             Patient will benefit from skilled therapeutic intervention in order to improve the following deficits and impairments:  Postural dysfunction, Decreased knowledge of precautions, Impaired UE functional use, Impaired flexibility, Increased fascial restricitons, Decreased strength, Decreased range of motion  Visit Diagnosis: Stiffness of right shoulder, not elsewhere classified  Aftercare following  surgery for neoplasm  Abnormal posture  Stiffness of left shoulder, not elsewhere classified  Malignant neoplasm of upper-outer quadrant of right breast in female, estrogen receptor positive ([facility]     Problem List Patient Active Problem List   Diagnosis Date Noted  Breast cancer, right ([place]) 10/12/2020   History of cervical dysplasia 10/06/2020   Malignant neoplasm of lower-inner quadrant of right breast of female, estrogen receptor positive ([place]) 09/16/2020   BRCA1 gene mutation positive in female    Family history of BRCA1 gene positive    Family history of breast cancer    Family history of prostate cancer    Family history of colon cancer     Stark Bray 02/17/2021, 9:52 AM  [place], [place], [address] Phone: [phone]   Fax:  [phone]  Name: Pamela [name] MRN: [id number] Date of Birth: 1976-06-06

## 2021-02-17 NOTE — Progress Notes (Signed)
SURVIVORSHIP VISIT:  BRIEF ONCOLOGIC HISTORY:  Oncology History  Malignant neoplasm of lower-inner quadrant of right breast of female, estrogen receptor positive ([place])  09/07/2020 Initial Diagnosis   Palpable right axillary mass.  Mammogram: 0.7 cm mass mid axilla right breast: Biopsy IDC with DCIS, grade 2, ER 85%, PR 96%, HER2 negative 1.4 cm mass in the left breast 4 o'clock position: Cyst    Miscellaneous   BRCA1 mutation   10/12/2020 Surgery   Bilateral mastectomies with reconstruction Pamela [name] & Pamela [name]):  Left breast: no malignancy , Right breast: invasive and in situ ductal carcinoma, 0.7cm, clear margins, 4 right axillary lymph nodes negative for carcinoma.    10/12/2020 Cancer Staging   Staging form: Breast, AJCC 8th Edition - Pathologic stage from 10/12/2020: Stage IA (pT1b, pN0, cM0, G1, ER+, PR+, HER2-) - Signed by Pamela Phlegm, NP on 02/13/2021  Stage prefix: Initial diagnosis  Histologic grading system: 3 grade system    09/2020 -  Anti-estrogen oral therapy   Tamoxifen daily     INTERVAL HISTORY:  Pamela [name] to review her survivorship care plan detailing her treatment course for breast cancer, as well as monitoring long-term side effects of that treatment, education regarding health maintenance, screening, and overall wellness and health promotion.     Overall, Pamela [name] reports feeling quite well She is taking tamoxifen daily. She does have some intimacy concerns she shared with my nurse Val.  She has not been taking tamoxifen during her surgeries.  She has an upcoming BSO next week and plans to stay off tamoxifen 2-4 weeks following her surgery.    REVIEW OF SYSTEMS:  Review of Systems  Constitutional:  Negative for appetite change, chills, fatigue, fever and unexpected weight change.  HENT:   Negative for hearing loss, lump/mass and trouble swallowing.   Eyes:  Negative for eye problems and icterus.  Respiratory:  Negative for chest tightness, cough  and shortness of breath.   Cardiovascular:  Negative for chest pain, leg swelling and palpitations.  Gastrointestinal:  Negative for abdominal distention, abdominal pain, constipation, diarrhea, nausea and vomiting.  Endocrine: Negative for hot flashes.  Genitourinary:  Negative for difficulty urinating.   Musculoskeletal:  Negative for arthralgias.  Skin:  Negative for itching and rash.  Neurological:  Negative for dizziness, extremity weakness, headaches and numbness.  Hematological:  Negative for adenopathy. Does not bruise/bleed easily.  Psychiatric/Behavioral:  Negative for depression. The patient is not nervous/anxious.   Breast: Denies any new nodularity, masses, tenderness, nipple changes, or nipple discharge.     ONCOLOGY TREATMENT TEAM:  1. Surgeon:  Dr. Donne [name] at [facility] 2. Medical Oncologist: Dr. Lindi Adie  3. Plastic Surgeon: Dr. Iran Planas    PAST MEDICAL/SURGICAL HISTORY:  Past Medical History:  Diagnosis Date   BRCA1 gene mutation positive in female    Cancer [facility]) 2021   right   Family history of BRCA1 gene positive    Family history of breast cancer    Family history of colon cancer    Family history of prostate cancer    GERD (gastroesophageal reflux disease)    Pneumonia    2015 or 2016   PONV (postoperative nausea and vomiting)    Wears glasses    for reading   Past Surgical History:  Procedure Laterality Date   BREAST RECONSTRUCTION WITH PLACEMENT OF TISSUE EXPANDER AND ALLODERM Bilateral 10/12/2020   Procedure: BILATERAL BREAST RECONSTRUCTION WITH PLACEMENT OF TISSUE EXPANDER AND ALLODERM;  Surgeon: Pamela Limbo, MD;  Location: [facility];  Service: Plastics;  Laterality: Bilateral;   CERVICAL DISC SURGERY  2017   ruptured disc neck with prosthetic replacement   GANGLION CYST EXCISION Left 2019   left wrist yrs   LIPOSUCTION WITH LIPOFILLING Bilateral 01/06/2021   Procedure: LIPOFILLING FROM ABDOMEN TO BILATERAL  CHEST;  Surgeon: Pamela Limbo, MD;  Location: [place];  Service: Plastics;  Laterality: Bilateral;   NIPPLE SPARING MASTECTOMY WITH SENTINEL LYMPH NODE BIOPSY Bilateral 10/12/2020   Procedure: BILATERAL NIPPLE SPARING MASTECTOMY WITH RIGHT AXILLARY SENTINEL LYMPH NODE BIOPSY;  Surgeon: Pamela Bookbinder, MD;  Location: [place];  Service: General;  Laterality: Bilateral;  PEC BLOCK, RNFA   REMOVAL OF BILATERAL TISSUE EXPANDERS WITH PLACEMENT OF BILATERAL BREAST IMPLANTS Bilateral 01/06/2021   Procedure: REMOVAL OF BILATERAL TISSUE EXPANDERS WITH PLACEMENT OF BILATERAL BREAST SILICONE IMPLANTS;  Surgeon: Pamela Limbo, MD;  Location: [place];  Service: Plastics;  Laterality: Bilateral;   TUBAL LIGATION     yrs ago     ALLERGIES:  Allergies  Allergen Reactions   Metronidazole Swelling and Other (See Comments)    Itching around lips and mouth Swollen Lips    Sulfamethoxazole-Trimethoprim Diarrhea and Nausea Only   Baclofen Nausea Only   Chlorhexidine Rash   Wound Dressing Adhesive Rash    Dermabond     CURRENT MEDICATIONS:  Outpatient Encounter Medications as of 02/17/2021  Medication Sig   albuterol (VENTOLIN HFA) 108 (90 Base) MCG/ACT inhaler Inhale 2 puffs into the lungs.   esomeprazole (NEXIUM) 40 MG capsule Take 40 mg by mouth.   FLUoxetine (PROZAC) 20 MG capsule Take by mouth.   hydrOXYzine (ATARAX/VISTARIL) 25 MG tablet Take by mouth.   mirtazapine (REMERON) 7.5 MG tablet Take 7.5 mg by mouth at bedtime.   omeprazole (PRILOSEC) 20 MG capsule Take 20 mg by mouth as needed.   tamoxifen (NOLVADEX) 20 MG tablet Take 1 tablet (20 mg total) by mouth daily.   [DISCONTINUED] ondansetron (ZOFRAN) 8 MG tablet    No facility-administered encounter medications on file as of 02/17/2021.     ONCOLOGIC FAMILY HISTORY:  Family History  Problem Relation Age of Onset   Breast cancer Mother        dx. in her 51s   Breast  cancer Sister 43       BRCA1 positive: [id number] ([facility])   Cancer Maternal Aunt        unconfirmed, patient suspects that she died from cancer younger than 76   Breast cancer Maternal Grandmother        bilateral   Cancer Maternal Grandmother        gynecologic   Prostate cancer Maternal Uncle        dx. in his 38s   Colon cancer Paternal Aunt        died in her early 39s   Breast cancer Cousin 23       maternal first cousin   Breast cancer Cousin        dx. in her 65s, paternal first cousin     GENETIC COUNSELING/TESTING: BRCA1 +  SOCIAL HISTORY:  Social History   Socioeconomic History   Marital status: Married    Spouse name: Clorine Swing   Number of children: 3   Years of education: Not on file   Highest education level: Not on file  Occupational History   Occupation: [employer]  Tobacco Use   Smoking status: Former    Packs/day: 0.12  Years: 5.00    Pack years: 0.60    Types: Cigarettes   Smokeless tobacco: Never   Tobacco comments:    quit 15 years ago  Vaping Use   Vaping Use: Never used  Substance and Sexual Activity   Alcohol use: Yes    Alcohol/week: 2.0 standard drinks    Types: 2 Glasses of wine per week    Comment: occ   Drug use: Never   Sexual activity: Yes    Birth control/protection: Surgical  Other Topics Concern   Not on file  Social History Narrative   ** Merged History Encounter **       Social Determinants of Health   Financial Resource Strain: Not on file  Food Insecurity: Not on file  Transportation Needs: Not on file  Physical Activity: Not on file  Stress: Not on file  Social Connections: Not on file  Intimate Partner Violence: Not on file     OBSERVATIONS/OBJECTIVE:  BP 119/74 (BP Location: Left Arm, Patient Position: Standing)   Pulse 82   Temp 97.6 F (36.4 C) (Temporal)   Resp 18   Ht 5' 7"  (1.702 m)   Wt 153 lb 9.6 oz (69.7 kg)   LMP 02/03/2021   BMI 24.06 kg/m  GENERAL: Patient is a well  appearing female in no acute distress HEENT:  Sclerae anicteric. Mask in place Neck is supple.  NODES:  No cervical, supraclavicular, or axillary lymphadenopathy palpated.  BREAST EXAM:  s/p bilateral nipple sparing mastectomies with reconstruction, no sign of local recurrence LUNGS:  Clear to auscultation bilaterally.  No wheezes or rhonchi. HEART:  Regular rate and rhythm. No murmur appreciated. ABDOMEN:  Soft, nontender.  Positive, normoactive bowel sounds. No organomegaly palpated. MSK:  No focal spinal tenderness to palpation. Full range of motion bilaterally in the upper extremities. EXTREMITIES:  No peripheral edema.   SKIN:  Clear with no obvious rashes or skin changes. No nail dyscrasia. NEURO:  Nonfocal. Well oriented.  Appropriate affect.   LABORATORY DATA:  None for this visit.  DIAGNOSTIC IMAGING:  None for this visit.      ASSESSMENT AND PLAN:  Ms.. Pamela [name] is a pleasant 45 y.o. female with Stage IA right breast invasive ductal carcinoma, ER+/PR+/HER2-, diagnosed in 08/2020, treated with lumpectomy, adjuvant radiation therapy, and anti-estrogen therapy with Tamoxifen daily beginning in 10/2020.  She presents to the Survivorship Clinic for our initial meeting and routine follow-up post-completion of treatment for breast cancer.    1. Stage IA right breast cancer:  Ms. Mccosh is continuing to recover from definitive treatment for breast cancer. She will follow-up with her medical oncologist, Dr. Lindi Adie in 6 months with history and physical exam per surveillance protocol.  She will continue her anti-estrogen therapy with Tamoxifen. Thus far, she is tolerating the Tamoxifen well, with minimal side effects. She was instructed to make Dr. Lindi Adie or myself aware if she begins to experience any worsening side effects of the medication and I could see her back in clinic to help manage those side effects, as needed. Today, a comprehensive survivorship care plan and treatment summary was  reviewed with the patient today detailing her breast cancer diagnosis, treatment course, potential late/long-term effects of treatment, appropriate follow-up care with recommendations for the future, and patient education resources.  A copy of this summary, along with a letter will be sent to the patient's primary care provider via mail/fax/In Basket message after today's visit.    2. Bone health:  She will go  into surgical menopause next week.  We discussed that tamoxifen is protective of her bone health, however would recommend starting bone density testing in 1-2 years.  She was given education on specific activities to promote bone health.  3. Cancer screening:  Due to Ms. Gagliardo's history and her age, she should receive screening for skin cancers, colon cancer, ? Pancreatic cancer, and gynecologic cancers.  The information and recommendations are listed on the patient's comprehensive care plan/treatment summary and were reviewed in detail with the patient.    4. Health maintenance and wellness promotion: Ms. Bolick was encouraged to consume 5-7 servings of fruits and vegetables per day. We reviewed the "Nutrition Rainbow" handout, as well as the handout "Take Control of Your Health and Reduce Your Cancer Risk" from the [place].  She was also encouraged to engage in moderate to vigorous exercise for 30 minutes per day most days of the week. We discussed the [employer] fitness program, which is designed for cancer survivors to help them become more physically fit after cancer treatments.  She was instructed to limit her alcohol consumption and continue to abstain from tobacco use.     5. Support services/counseling: It is not uncommon for this period of the patient's cancer care trajectory to be one of many emotions and stressors.  We discussed how this can be increasingly difficult during the times of quarantine and social distancing due to the COVID-19 pandemic.   She was given  information regarding our available services and encouraged to contact me with any questions or for help enrolling in any of our support group/programs.    Follow up instructions:    -Return to cancer center in 6 months for f/u with Dr. Lindi Adie  -She is welcome to return back to the Survivorship Clinic at any time; no additional follow-up needed at this time.  -Consider referral back to survivorship as a long-term survivor for continued surveillance  The patient was provided an opportunity to ask questions and all were answered. The patient agreed with the plan and demonstrated an understanding of the instructions.   Total encounter time: 30 minutes* in SCP preparation, chart review, order entry, face to face visit time, care coordination, and documentation of the encounter*  Wilber Bihari, NP 02/17/21 3:25 PM Medical Oncology and Hematology [facility] [place],  [address] Tel. [phone]    Fax. [phone]  *Total Encounter Time as defined by the Centers for Medicare and Medicaid Services includes, in addition to the face-to-face time of a patient visit (documented in the note above) non-face-to-face time: obtaining and reviewing outside history, ordering and reviewing medications, tests or procedures, care coordination (communications with other health care professionals or caregivers) and documentation in the medical record.

## 2021-02-17 NOTE — Telephone Encounter (Signed)
Canceled COVID test and notified the patent

## 2021-02-20 ENCOUNTER — Other Ambulatory Visit: Payer: Self-pay

## 2021-02-20 ENCOUNTER — Other Ambulatory Visit (HOSPITAL_COMMUNITY): Payer: BC Managed Care – PPO

## 2021-02-20 ENCOUNTER — Encounter (HOSPITAL_COMMUNITY)
Admission: RE | Admit: 2021-02-20 | Discharge: 2021-02-20 | Disposition: A | Discharge status: Home / Self Care | Payer: BC Managed Care – PPO | Source: Ambulatory Visit | Attending: Gynecologic Oncology | Admitting: Gynecologic Oncology

## 2021-02-20 ENCOUNTER — Telehealth: Payer: Self-pay | Admitting: Hematology and Oncology

## 2021-02-20 ENCOUNTER — Ambulatory Visit: Payer: BC Managed Care – PPO

## 2021-02-20 DIAGNOSIS — Z01812 Encounter for preprocedural laboratory examination: Secondary | ICD-10-CM | POA: Insufficient documentation

## 2021-02-20 DIAGNOSIS — Z483 Aftercare following surgery for neoplasm: Secondary | ICD-10-CM

## 2021-02-20 DIAGNOSIS — Z1502 Genetic susceptibility to malignant neoplasm of ovary: Secondary | ICD-10-CM | POA: Diagnosis not present

## 2021-02-20 DIAGNOSIS — M25612 Stiffness of left shoulder, not elsewhere classified: Secondary | ICD-10-CM

## 2021-02-20 DIAGNOSIS — Z881 Allergy status to other antibiotic agents status: Secondary | ICD-10-CM | POA: Diagnosis not present

## 2021-02-20 DIAGNOSIS — Z9013 Acquired absence of bilateral breasts and nipples: Secondary | ICD-10-CM | POA: Diagnosis not present

## 2021-02-20 DIAGNOSIS — Z803 Family history of malignant neoplasm of breast: Secondary | ICD-10-CM | POA: Diagnosis not present

## 2021-02-20 DIAGNOSIS — Z888 Allergy status to other drugs, medicaments and biological substances status: Secondary | ICD-10-CM | POA: Diagnosis not present

## 2021-02-20 DIAGNOSIS — Z853 Personal history of malignant neoplasm of breast: Secondary | ICD-10-CM | POA: Diagnosis not present

## 2021-02-20 DIAGNOSIS — Z1501 Genetic susceptibility to malignant neoplasm of breast: Secondary | ICD-10-CM | POA: Diagnosis not present

## 2021-02-20 DIAGNOSIS — M25611 Stiffness of right shoulder, not elsewhere classified: Secondary | ICD-10-CM

## 2021-02-20 DIAGNOSIS — R293 Abnormal posture: Secondary | ICD-10-CM

## 2021-02-20 DIAGNOSIS — Z170 Estrogen receptor positive status [ER+]: Secondary | ICD-10-CM

## 2021-02-20 DIAGNOSIS — Z4002 Encounter for prophylactic removal of ovary: Secondary | ICD-10-CM | POA: Diagnosis not present

## 2021-02-20 LAB — CBC
HCT: 40.3 % (ref 36.0–46.0)
Hemoglobin: 13.2 g/dL (ref 12.0–15.0)
MCH: 29.1 pg (ref 26.0–34.0)
MCHC: 32.8 g/dL (ref 30.0–36.0)
MCV: 88.8 fL (ref 80.0–100.0)
Platelets: 269 10*3/uL (ref 150–400)
RBC: 4.54 MIL/uL (ref 3.87–5.11)
RDW: 12.8 % (ref 11.5–15.5)
WBC: 4.7 10*3/uL (ref 4.0–10.5)
nRBC: 0 % (ref 0.0–0.2)

## 2021-02-20 LAB — URINALYSIS, ROUTINE W REFLEX MICROSCOPIC
Bilirubin Urine: NEGATIVE
Glucose, UA: NEGATIVE mg/dL
Ketones, ur: NEGATIVE mg/dL
Leukocytes,Ua: NEGATIVE
Nitrite: NEGATIVE
Protein, ur: NEGATIVE mg/dL
Specific Gravity, Urine: 1.009 (ref 1.005–1.030)
pH: 6 (ref 5.0–8.0)

## 2021-02-20 LAB — BASIC METABOLIC PANEL
Anion gap: 7 (ref 5–15)
BUN: 12 mg/dL (ref 6–20)
CO2: 24 mmol/L (ref 22–32)
Calcium: 9.2 mg/dL (ref 8.9–10.3)
Chloride: 106 mmol/L (ref 98–111)
Creatinine, Ser: 0.75 mg/dL (ref 0.44–1.00)
GFR, Estimated: >60 mL/min (ref >60)
Glucose, Bld: 88 mg/dL (ref 70–99)
Potassium: 3.9 mmol/L (ref 3.5–5.1)
Sodium: 137 mmol/L (ref 135–145)

## 2021-02-20 NOTE — Telephone Encounter (Signed)
Scheduled appointment per 06/24 los. Left message.

## 2021-02-20 NOTE — Therapy (Addendum)
[place], [place], [address] Phone: [phone]   Fax:  [phone]  Physical Therapy Treatment  Patient Details  Name: Pamela [name] MRN: [id number] Date of Birth: [birth year] Referring Provider (PT): Dr. Donne Hazel   Encounter Date: 02/20/2021   PT End of Session - 02/20/21 1205     Visit Number 17    Number of Visits 18    Date for PT Re-Evaluation 02/27/21    PT Start Time 1104    PT Stop Time 1202    PT Time Calculation (min) 58 min    Activity Tolerance Patient tolerated treatment well    Behavior During Therapy [facility] for tasks assessed/performed             Past Medical History:  Diagnosis Date   BRCA1 gene mutation positive in female    Cancer [facility]) 2021   right   Family history of BRCA1 gene positive    Family history of breast cancer    Family history of colon cancer    Family history of prostate cancer    GERD (gastroesophageal reflux disease)    Pneumonia    2015 or 2016   PONV (postoperative nausea and vomiting)    Wears glasses    for reading    Past Surgical History:  Procedure Laterality Date   BREAST RECONSTRUCTION WITH PLACEMENT OF TISSUE EXPANDER AND ALLODERM Bilateral 10/12/2020   Procedure: BILATERAL BREAST RECONSTRUCTION WITH PLACEMENT OF TISSUE EXPANDER AND ALLODERM;  Surgeon: Irene Limbo, MD;  Location: [place];  Service: Plastics;  Laterality: Bilateral;   CERVICAL DISC SURGERY  2017   ruptured disc neck with prosthetic replacement   GANGLION CYST EXCISION Left 2019   left wrist yrs   LIPOSUCTION WITH LIPOFILLING Bilateral 01/06/2021   Procedure: LIPOFILLING FROM ABDOMEN TO BILATERAL CHEST;  Surgeon: Irene Limbo, MD;  Location: [place];  Service: Plastics;  Laterality: Bilateral;   NIPPLE SPARING MASTECTOMY WITH SENTINEL LYMPH NODE BIOPSY Bilateral 10/12/2020   Procedure: BILATERAL NIPPLE SPARING MASTECTOMY WITH  RIGHT AXILLARY SENTINEL LYMPH NODE BIOPSY;  Surgeon: Rolm Bookbinder, MD;  Location: [place];  Service: General;  Laterality: Bilateral;  PEC BLOCK, RNFA   REMOVAL OF BILATERAL TISSUE EXPANDERS WITH PLACEMENT OF BILATERAL BREAST IMPLANTS Bilateral 01/06/2021   Procedure: REMOVAL OF BILATERAL TISSUE EXPANDERS WITH PLACEMENT OF BILATERAL BREAST SILICONE IMPLANTS;  Surgeon: Irene Limbo, MD;  Location: [place];  Service: Plastics;  Laterality: Bilateral;   TUBAL LIGATION     yrs ago    There were no vitals filed for this visit.   Subjective Assessment - 02/20/21 1108     Subjective My Rt shoulder has been great since I was here last. I haven't felt the pinch since then and we didn't feel it that day. I do want to the patch (ionto) again because I have my hysterectomy Wednesday and I don't any set backs.    Pertinent History 10/12/20 bilateral nipple sparing mastectomy with R SLNB (4 nodes all negative) for treatment of R breast cancer that is ER/PR+,HER2-, BRCA 1 positive, plan is to have total hysterectomy on 02/22/21    Patient Stated Goals to gain info from provider                [facility] PT Assessment - 02/20/21 0001       AROM   Right Shoulder Flexion 157 Degrees   pull felt in axilla at end motion  Right Shoulder ABduction 155 Degrees   pull felt in axilla at end motion   Left Shoulder Flexion 150 Degrees   pt reports pull here   Left Shoulder ABduction 164 Degrees                           [facility] Adult PT Treatment/Exercise - 02/20/21 0001       Exercises   Other Exercises  Forearms on wall holding green theraband taut for following: Walk ups, then rows 2 x 5 each with brief rest after each with backward shoulder rolls; row (with back against wall) into ER with green band x 15 all without pinch      Iontophoresis   Type of Iontophoresis Dexamethasone    Location Rt posterior shoulder    Time 6hr wear patch       Manual Therapy   Joint Mobilization various positions for inferior and AP glides grade IV- overall, arm at 90deg abduction, arm at 90/90, and at end range flexion and abduction prior to P1    Passive ROM in supine to R shoulder in to flexion, abd and D2 with downward pressure on scapula to decrease impingment                         PT Long Term Goals - 02/20/21 1205       PT LONG TERM GOAL #1   Title Pt will return to baseline ROM measurements and not demonstrate any signs/symptoms of lymphedema.    Status Partially Met      PT LONG TERM GOAL #2   Title Pt will demonstrate 170 degrees of bilateral shoulder flexion to allow pt to reach overhead.    Baseline R 101, L 75; 12/05/20- R 150, L 152; 01/30/21- R 155, L 165; Rt 157 (pull felt in axilla at end ROM) and Lt 150 degrees - 02/20/21    Status Not Met      PT LONG TERM GOAL #3   Title Pt will demonstrate 170 degrees of bilateral shoulder abduction to allow her to reach out to the side    Baseline R 75, L 81; 12/05/20- R 122, L 111; 01/30/21- R 170, L 175; Rt 155 (pull felt in axilla) and Lt 164 degrees - 02/20/21    Status Not Met      PT LONG TERM GOAL #4   Title Pt will be independent in a home exercise program for continued strengthening and stretching.    Status Achieved                   Plan - 02/20/21 1217     Clinical Impression Statement Pt continues feeling great relief from pinching in superior Rt shoulder with A/ROM though still reports tightness in axilla at end of ROMs which is still limiting her motion preventing her from meeting her ROM goals. Pt is very pleased with progress thus far though and reports feeling ready for D/C to HEP at this time. She has surgery Wednesday (hysterectomy) and would like to be done with therapy as well due to that. Pt will do well with continuing with her HEP and was instructed in safe progression today, also issued her a green theraband to progress to when ready at home  (once red becomes easier). She was able to verbalize good understanding of this. Pt is D/C at this time.    Stability/Clinical Decision Making Stable/Uncomplicated  Rehab Potential Good    PT Frequency 1x / week    PT Duration 4 weeks    PT Treatment/Interventions ADLs/Self Care Home Management;Patient/family education;Therapeutic exercise;Manual techniques;Passive range of motion;Iontophoresis 36m/ml Dexamethasone    PT Next Visit Plan D/C this visit.    PT Home Exercise Plan post op shoulder ROM; supine over towel roll for bil UE's and scapular mobility; supine scapular series with yellow theraband; bil UE 3 way raises    Consulted and Agree with Plan of Care Patient             Patient will benefit from skilled therapeutic intervention in order to improve the following deficits and impairments:  Postural dysfunction, Decreased knowledge of precautions, Impaired UE functional use, Impaired flexibility, Increased fascial restricitons, Decreased strength, Decreased range of motion  Visit Diagnosis: Stiffness of right shoulder, not elsewhere classified  Aftercare following surgery for neoplasm  Abnormal posture  Stiffness of left shoulder, not elsewhere classified  Malignant neoplasm of upper-outer quadrant of right breast in female, estrogen receptor positive ([place]     Problem List Patient Active Problem List   Diagnosis Date Noted   Breast cancer, right ([place] 10/12/2020   History of cervical dysplasia 10/06/2020   Malignant neoplasm of lower-inner quadrant of right breast of female, estrogen receptor positive ([place] 09/16/2020   BRCA1 gene mutation positive in female    Family history of BRCA1 gene positive    Family history of breast cancer    Family history of prostate cancer    Family history of colon cancer     ROtelia Limes PTA 02/20/2021, 12:24 PM  [place]  [address]Phone: [phone]  Fax:  [phone] Name: TTONEY LIZAOLAMRN: [id number] of Birth: 108/07/1976  PHYSICAL THERAPY DISCHARGE SUMMARY  Visits from Start of Care: 17  Current functional level related to goals / functional outcomes: See above   Remaining deficits: See above   Education / Equipment: HEP   Patient agrees to discharge. Patient goals were partially met. Patient is being discharged due to being pleased with the current functional level.  BAllyson SabalBWymore PVirginia11/01/23 10:54 AM

## 2021-02-21 ENCOUNTER — Telehealth: Payer: Self-pay

## 2021-02-21 LAB — URINE CULTURE: Culture: <10000 — AB

## 2021-02-21 NOTE — Telephone Encounter (Signed)
Pamela [name] states that she understands her written pre-op instructions dated 02-16-21. She has held her Tamoxifen Since her previous surgery on 01-06-21. Told her that she can resume the tamoxifen 7 days after her surgery tomorrow 02-21-21. Pt verbalized understanding.

## 2021-02-21 NOTE — Anesthesia Preprocedure Evaluation (Addendum)
Anesthesia Evaluation  Patient identified by MRN, date of birth, ID band Patient awake    Reviewed: Allergy & Precautions, H&P , NPO status , Patient's Chart, lab work & pertinent test results  History of Anesthesia Complications (+) PONV  Airway Mallampati: II  TM Distance: >3 FB Neck ROM: Full    Dental no notable dental hx. (+) Teeth Intact, Dental Advisory Given   Pulmonary neg pulmonary ROS, former smoker,    Pulmonary exam normal breath sounds clear to auscultation       Cardiovascular Exercise Tolerance: Good negative cardio ROS   Rhythm:Regular Rate:Normal     Neuro/Psych negative neurological ROS  negative psych ROS   GI/Hepatic Neg liver ROS, GERD  Medicated,  Endo/Other  negative endocrine ROS  Renal/GU negative Renal ROS  negative genitourinary   Musculoskeletal   Abdominal   Peds  Hematology negative hematology ROS (+)   Anesthesia Other Findings   Reproductive/Obstetrics negative OB ROS                            Anesthesia Physical Anesthesia Plan  ASA: 2  Anesthesia Plan: General   Post-op Pain Management:    Induction: Intravenous  PONV Risk Score and Plan: 4 or greater and Ondansetron, Aprepitant, Dexamethasone, Midazolam and Scopolamine patch - Pre-op  Airway Management Planned: Oral ETT  Additional Equipment:   Intra-op Plan:   Post-operative Plan: Extubation in OR  Informed Consent: I have reviewed the patients History and Physical, chart, labs and discussed the procedure including the risks, benefits and alternatives for the proposed anesthesia with the patient or authorized representative who has indicated his/her understanding and acceptance.     Dental advisory given  Plan Discussed with: CRNA  Anesthesia Plan Comments:        Anesthesia Quick Evaluation

## 2021-02-22 ENCOUNTER — Encounter (HOSPITAL_BASED_OUTPATIENT_CLINIC_OR_DEPARTMENT_OTHER)
Admission: RE | Disposition: A | Discharge status: Home / Self Care | Payer: Self-pay | Source: Home / Self Care | Attending: Gynecologic Oncology

## 2021-02-22 ENCOUNTER — Other Ambulatory Visit: Payer: Self-pay

## 2021-02-22 ENCOUNTER — Ambulatory Visit (HOSPITAL_BASED_OUTPATIENT_CLINIC_OR_DEPARTMENT_OTHER)
Admission: RE | Admit: 2021-02-22 | Discharge: 2021-02-22 | Disposition: A | Discharge status: Home / Self Care | Payer: BC Managed Care – PPO | Attending: Gynecologic Oncology | Admitting: Gynecologic Oncology

## 2021-02-22 ENCOUNTER — Ambulatory Visit (HOSPITAL_BASED_OUTPATIENT_CLINIC_OR_DEPARTMENT_OTHER): Payer: BC Managed Care – PPO | Admitting: Anesthesiology

## 2021-02-22 ENCOUNTER — Encounter (HOSPITAL_BASED_OUTPATIENT_CLINIC_OR_DEPARTMENT_OTHER): Payer: Self-pay | Admitting: Gynecologic Oncology

## 2021-02-22 DIAGNOSIS — Z1501 Genetic susceptibility to malignant neoplasm of breast: Secondary | ICD-10-CM

## 2021-02-22 DIAGNOSIS — Z4002 Encounter for prophylactic removal of ovary: Secondary | ICD-10-CM | POA: Diagnosis not present

## 2021-02-22 DIAGNOSIS — Z1502 Genetic susceptibility to malignant neoplasm of ovary: Secondary | ICD-10-CM

## 2021-02-22 DIAGNOSIS — Z881 Allergy status to other antibiotic agents status: Secondary | ICD-10-CM | POA: Insufficient documentation

## 2021-02-22 DIAGNOSIS — Z888 Allergy status to other drugs, medicaments and biological substances status: Secondary | ICD-10-CM | POA: Insufficient documentation

## 2021-02-22 DIAGNOSIS — Z853 Personal history of malignant neoplasm of breast: Secondary | ICD-10-CM | POA: Diagnosis not present

## 2021-02-22 DIAGNOSIS — Z803 Family history of malignant neoplasm of breast: Secondary | ICD-10-CM | POA: Insufficient documentation

## 2021-02-22 DIAGNOSIS — Z148 Genetic carrier of other disease: Secondary | ICD-10-CM

## 2021-02-22 DIAGNOSIS — Z9013 Acquired absence of bilateral breasts and nipples: Secondary | ICD-10-CM | POA: Insufficient documentation

## 2021-02-22 DIAGNOSIS — Z8481 Family history of carrier of genetic disease: Secondary | ICD-10-CM

## 2021-02-22 HISTORY — DX: Presence of spectacles and contact lenses: Z97.3

## 2021-02-22 HISTORY — PX: ROBOTIC ASSISTED TOTAL HYSTERECTOMY WITH BILATERAL SALPINGO OOPHERECTOMY: SHX6086

## 2021-02-22 HISTORY — DX: Gastro-esophageal reflux disease without esophagitis: K21.9

## 2021-02-22 HISTORY — DX: Pneumonia, unspecified organism: J18.9

## 2021-02-22 LAB — ABO/RH: ABO/RH(D): B NEG

## 2021-02-22 LAB — TYPE AND SCREEN
ABO/RH(D): B NEG
Antibody Screen: NEGATIVE

## 2021-02-22 LAB — POCT PREGNANCY, URINE: Preg Test, Ur: NEGATIVE

## 2021-02-22 SURGERY — HYSTERECTOMY, TOTAL, ROBOT-ASSISTED, LAPAROSCOPIC, WITH BILATERAL SALPINGO-OOPHORECTOMY
Anesthesia: General | Site: Abdomen | Laterality: Bilateral

## 2021-02-22 MED ORDER — HYDROMORPHONE HCL 1 MG/ML IJ SOLN
INTRAMUSCULAR | Status: AC
  Filled 2021-02-22: qty 1

## 2021-02-22 MED ORDER — PHENYLEPHRINE 40 MCG/ML (10ML) SYRINGE FOR IV PUSH (FOR BLOOD PRESSURE SUPPORT)
PREFILLED_SYRINGE | INTRAVENOUS | Status: DC | PRN
  Administered 2021-02-22 (×3): 120 ug via INTRAVENOUS

## 2021-02-22 MED ORDER — LIDOCAINE 2% (20 MG/ML) 5 ML SYRINGE
INTRAMUSCULAR | Status: DC | PRN
  Administered 2021-02-22: 1.5 mg/kg/h via INTRAVENOUS

## 2021-02-22 MED ORDER — KETAMINE HCL 50 MG/5ML IJ SOSY
PREFILLED_SYRINGE | INTRAMUSCULAR | Status: AC
  Filled 2021-02-22: qty 5

## 2021-02-22 MED ORDER — ROCURONIUM BROMIDE 10 MG/ML (PF) SYRINGE
PREFILLED_SYRINGE | INTRAVENOUS | Status: DC | PRN
  Administered 2021-02-22: 20 mg via INTRAVENOUS
  Administered 2021-02-22: 60 mg via INTRAVENOUS

## 2021-02-22 MED ORDER — PHENYLEPHRINE 40 MCG/ML (10ML) SYRINGE FOR IV PUSH (FOR BLOOD PRESSURE SUPPORT)
PREFILLED_SYRINGE | INTRAVENOUS | Status: AC
  Filled 2021-02-22: qty 10

## 2021-02-22 MED ORDER — HYDROMORPHONE HCL 1 MG/ML IJ SOLN
1.0000 mg | Freq: Once | INTRAMUSCULAR | Status: AC
  Administered 2021-02-22: 1 mg via INTRAVENOUS

## 2021-02-22 MED ORDER — ENOXAPARIN SODIUM 40 MG/0.4ML IJ SOSY
40.0000 mg | PREFILLED_SYRINGE | INTRAMUSCULAR | Status: AC
  Administered 2021-02-22: 07:00:00 40 mg via SUBCUTANEOUS

## 2021-02-22 MED ORDER — SCOPOLAMINE 1 MG/3DAYS TD PT72
1.0000 | MEDICATED_PATCH | TRANSDERMAL | Status: DC
  Administered 2021-02-22: 07:00:00 1.5 mg via TRANSDERMAL

## 2021-02-22 MED ORDER — ROCURONIUM BROMIDE 10 MG/ML (PF) SYRINGE
PREFILLED_SYRINGE | INTRAVENOUS | Status: AC
  Filled 2021-02-22: qty 10

## 2021-02-22 MED ORDER — ACETAMINOPHEN 500 MG PO TABS
1000.0000 mg | ORAL_TABLET | ORAL | Status: AC
  Administered 2021-02-22: 07:00:00 1000 mg via ORAL

## 2021-02-22 MED ORDER — ONDANSETRON HCL 4 MG/2ML IJ SOLN
INTRAMUSCULAR | Status: AC
  Filled 2021-02-22: qty 2

## 2021-02-22 MED ORDER — GABAPENTIN 300 MG PO CAPS
ORAL_CAPSULE | ORAL | Status: AC
  Filled 2021-02-22: qty 1

## 2021-02-22 MED ORDER — DEXAMETHASONE SODIUM PHOSPHATE 10 MG/ML IJ SOLN
INTRAMUSCULAR | Status: AC
  Filled 2021-02-22: qty 1

## 2021-02-22 MED ORDER — PROPOFOL 10 MG/ML IV BOLUS
INTRAVENOUS | Status: AC
  Filled 2021-02-22: qty 40

## 2021-02-22 MED ORDER — MIDAZOLAM HCL 2 MG/2ML IJ SOLN
INTRAMUSCULAR | Status: AC
  Filled 2021-02-22: qty 2

## 2021-02-22 MED ORDER — SUGAMMADEX SODIUM 200 MG/2ML IV SOLN: INTRAVENOUS | Status: DC | PRN

## 2021-02-22 MED ORDER — FENTANYL CITRATE (PF) 250 MCG/5ML IJ SOLN
INTRAMUSCULAR | Status: AC
  Filled 2021-02-22: qty 5

## 2021-02-22 MED ORDER — BUPIVACAINE HCL 0.25 % IJ SOLN
INTRAMUSCULAR | Status: DC | PRN
  Administered 2021-02-22: 10 mL

## 2021-02-22 MED ORDER — ACETAMINOPHEN 500 MG PO TABS: 1000.0000 mg | ORAL_TABLET | Freq: Once | ORAL | Status: DC

## 2021-02-22 MED ORDER — CEFAZOLIN SODIUM-DEXTROSE 2-4 GM/100ML-% IV SOLN
2.0000 g | INTRAVENOUS | Status: AC
  Administered 2021-02-22: 08:00:00 2 g via INTRAVENOUS

## 2021-02-22 MED ORDER — ONDANSETRON HCL 4 MG/2ML IJ SOLN
INTRAMUSCULAR | Status: DC | PRN
  Administered 2021-02-22: 4 mg via INTRAVENOUS

## 2021-02-22 MED ORDER — LACTATED RINGERS IV SOLN: INTRAVENOUS | Status: DC

## 2021-02-22 MED ORDER — CEFAZOLIN SODIUM-DEXTROSE 2-4 GM/100ML-% IV SOLN
INTRAVENOUS | Status: AC
  Filled 2021-02-22: qty 100

## 2021-02-22 MED ORDER — CELECOXIB 200 MG PO CAPS
ORAL_CAPSULE | ORAL | Status: AC
  Filled 2021-02-22: qty 2

## 2021-02-22 MED ORDER — ACETAMINOPHEN 500 MG PO TABS
ORAL_TABLET | ORAL | Status: AC
  Filled 2021-02-22: qty 2

## 2021-02-22 MED ORDER — CELECOXIB 200 MG PO CAPS
400.0000 mg | ORAL_CAPSULE | ORAL | Status: AC
  Administered 2021-02-22: 07:00:00 400 mg via ORAL

## 2021-02-22 MED ORDER — FENTANYL CITRATE (PF) 100 MCG/2ML IJ SOLN
INTRAMUSCULAR | Status: DC | PRN
  Administered 2021-02-22 (×2): 50 ug via INTRAVENOUS

## 2021-02-22 MED ORDER — APREPITANT 40 MG PO CAPS
40.0000 mg | ORAL_CAPSULE | Freq: Once | ORAL | Status: AC
  Administered 2021-02-22: 40 mg via ORAL

## 2021-02-22 MED ORDER — ARTIFICIAL TEARS OPHTHALMIC OINT
TOPICAL_OINTMENT | OPHTHALMIC | Status: AC
  Filled 2021-02-22: qty 3.5

## 2021-02-22 MED ORDER — APREPITANT 40 MG PO CAPS
ORAL_CAPSULE | ORAL | Status: AC
  Filled 2021-02-22: qty 1

## 2021-02-22 MED ORDER — HYDROMORPHONE HCL 1 MG/ML IJ SOLN
0.2500 mg | INTRAMUSCULAR | Status: DC | PRN
  Administered 2021-02-22: 0.5 mg via INTRAVENOUS
  Administered 2021-02-22: 0.25 mg via INTRAVENOUS
  Administered 2021-02-22: 0.5 mg via INTRAVENOUS
  Administered 2021-02-22: 0.25 mg via INTRAVENOUS
  Administered 2021-02-22: 0.5 mg via INTRAVENOUS

## 2021-02-22 MED ORDER — SCOPOLAMINE 1 MG/3DAYS TD PT72
MEDICATED_PATCH | TRANSDERMAL | Status: AC
  Filled 2021-02-22: qty 1

## 2021-02-22 MED ORDER — ENOXAPARIN SODIUM 40 MG/0.4ML IJ SOSY
PREFILLED_SYRINGE | INTRAMUSCULAR | Status: AC
  Filled 2021-02-22: qty 0.4

## 2021-02-22 MED ORDER — LIDOCAINE HCL (PF) 2 % IJ SOLN
INTRAMUSCULAR | Status: AC
  Filled 2021-02-22: qty 15

## 2021-02-22 MED ORDER — GABAPENTIN 300 MG PO CAPS
300.0000 mg | ORAL_CAPSULE | ORAL | Status: AC
Start: 2021-02-22 — End: 2021-02-22
  Administered 2021-02-22: 300 mg via ORAL

## 2021-02-22 MED ORDER — LIDOCAINE 2% (20 MG/ML) 5 ML SYRINGE
INTRAMUSCULAR | Status: DC | PRN
  Administered 2021-02-22: 60 mg via INTRAVENOUS

## 2021-02-22 MED ORDER — KETAMINE HCL 10 MG/ML IJ SOLN
INTRAMUSCULAR | Status: DC | PRN
  Administered 2021-02-22: 30 mg via INTRAVENOUS

## 2021-02-22 MED ORDER — PROPOFOL 10 MG/ML IV BOLUS
INTRAVENOUS | Status: DC | PRN
  Administered 2021-02-22: 120 mg via INTRAVENOUS

## 2021-02-22 MED ORDER — DEXAMETHASONE SODIUM PHOSPHATE 10 MG/ML IJ SOLN
4.0000 mg | INTRAMUSCULAR | Status: AC
  Administered 2021-02-22: 08:00:00 10 mg via INTRAVENOUS

## 2021-02-22 MED ORDER — SODIUM CHLORIDE 0.9 % IR SOLN
Status: DC | PRN
  Administered 2021-02-22: 1000 mL

## 2021-02-22 MED ORDER — SUGAMMADEX SODIUM 200 MG/2ML IV SOLN
INTRAVENOUS | Status: DC | PRN
  Administered 2021-02-22: 200 mg via INTRAVENOUS

## 2021-02-22 MED ORDER — MIDAZOLAM HCL 2 MG/2ML IJ SOLN
INTRAMUSCULAR | Status: DC | PRN
  Administered 2021-02-22: 2 mg via INTRAVENOUS

## 2021-02-22 SURGICAL SUPPLY — 75 items
ADH SKN CLS APL DERMABOND .7 (GAUZE/BANDAGES/DRESSINGS)
AGENT HMST KT MTR STRL THRMB (HEMOSTASIS)
APL ESCP 34 STRL LF DISP (HEMOSTASIS)
APL SKNCLS STERI-STRIP NONHPOA (GAUZE/BANDAGES/DRESSINGS) ×1
APPLICATOR SURGIFLO ENDO (HEMOSTASIS) IMPLANT
BAG LAPAROSCOPIC 12 15 PORT 16 (BASKET) IMPLANT
BAG RETRIEVAL 12/15 (BASKET)
BAG SPEC RTRVL LRG 6X4 10 (ENDOMECHANICALS)
BENZOIN TINCTURE PRP APPL 2/3 (GAUZE/BANDAGES/DRESSINGS) ×1 IMPLANT
BLADE SURG 10 STRL SS (BLADE) IMPLANT
COVER BACK TABLE 60X90IN (DRAPES) ×2 IMPLANT
COVER TIP SHEARS 8 DVNC (MISCELLANEOUS) ×1 IMPLANT
COVER TIP SHEARS 8MM DA VINCI (MISCELLANEOUS) ×2
DECANTER SPIKE VIAL GLASS SM (MISCELLANEOUS) IMPLANT
DERMABOND ADVANCED (GAUZE/BANDAGES/DRESSINGS)
DERMABOND ADVANCED .7 DNX12 (GAUZE/BANDAGES/DRESSINGS) ×1 IMPLANT
DRAPE ARM DVNC X/XI (DISPOSABLE) ×4 IMPLANT
DRAPE COLUMN DVNC XI (DISPOSABLE) ×1 IMPLANT
DRAPE DA VINCI XI ARM (DISPOSABLE) ×8
DRAPE DA VINCI XI COLUMN (DISPOSABLE) ×2
DRAPE SHEET LG 3/4 BI-LAMINATE (DRAPES) ×2 IMPLANT
DRAPE SURG IRRIG POUCH 19X23 (DRAPES) ×2 IMPLANT
ELECT REM PT RETURN 9FT ADLT (ELECTROSURGICAL) ×2
ELECTRODE REM PT RTRN 9FT ADLT (ELECTROSURGICAL) ×1 IMPLANT
GAUZE 4X4 16PLY ~~LOC~~+RFID DBL (SPONGE) ×2 IMPLANT
GLOVE SURG ENC MOIS LTX SZ6 (GLOVE) ×8 IMPLANT
GLOVE SURG ENC MOIS LTX SZ6.5 (GLOVE) ×4 IMPLANT
HEMOSTAT SURGICEL 2X14 (HEMOSTASIS) IMPLANT
HEMOSTAT SURGICEL 4X8 (HEMOSTASIS) IMPLANT
HOLDER FOLEY CATH W/STRAP (MISCELLANEOUS) ×1 IMPLANT
IRRIG SUCT STRYKERFLOW 2 WTIP (MISCELLANEOUS) ×2
IRRIGATION SUCT STRKRFLW 2 WTP (MISCELLANEOUS) ×1 IMPLANT
KIT PROCEDURE DA VINCI SI (MISCELLANEOUS)
KIT PROCEDURE DVNC SI (MISCELLANEOUS) IMPLANT
KIT TURNOVER CYSTO (KITS) ×2 IMPLANT
LEGGING LITHOTOMY PAIR STRL (DRAPES) ×2 IMPLANT
MANIPULATOR UTERINE 4.5 ZUMI (MISCELLANEOUS) ×2 IMPLANT
NDL SPNL 18GX3.5 QUINCKE PK (NEEDLE) IMPLANT
NEEDLE HYPO 22GX1.5 SAFETY (NEEDLE) ×2 IMPLANT
NEEDLE SPNL 18GX3.5 QUINCKE PK (NEEDLE) IMPLANT
OBTURATOR OPTICAL STANDARD 8MM (TROCAR) ×2
OBTURATOR OPTICAL STND 8 DVNC (TROCAR) ×1
OBTURATOR OPTICALSTD 8 DVNC (TROCAR) ×1 IMPLANT
PACK ROBOT GYN CUSTOM WL (TRAY / TRAY PROCEDURE) ×2 IMPLANT
PACK ROBOTIC GOWN (GOWN DISPOSABLE) ×2 IMPLANT
PAD POSITIONING PINK XL (MISCELLANEOUS) ×2 IMPLANT
PENCIL SMOKE EVACUATOR (MISCELLANEOUS) IMPLANT
PORT ACCESS TROCAR AIRSEAL 12 (TROCAR) ×1 IMPLANT
PORT ACCESS TROCAR AIRSEAL 5M (TROCAR) ×1
POUCH SPECIMEN RETRIEVAL 10MM (ENDOMECHANICALS) IMPLANT
SEAL CANN UNIV 5-8 DVNC XI (MISCELLANEOUS) ×3 IMPLANT
SEAL XI 5MM-8MM UNIVERSAL (MISCELLANEOUS) ×6
SET TRI-LUMEN FLTR TB AIRSEAL (TUBING) ×2 IMPLANT
STRIP CLOSURE SKIN 1/2X4 (GAUZE/BANDAGES/DRESSINGS) ×1 IMPLANT
SURGIFLO W/THROMBIN 8M KIT (HEMOSTASIS) IMPLANT
SUT MNCRL 0 1X36 CT-1 (SUTURE) IMPLANT
SUT MNCRL AB 4-0 PS2 18 (SUTURE) IMPLANT
SUT MON AB-0 CT1 36 (SUTURE) IMPLANT
SUT MONOCRYL 0 (SUTURE)
SUT VIC AB 0 CT1 36 (SUTURE) IMPLANT
SUT VIC AB 3-0 SH 27 (SUTURE)
SUT VIC AB 3-0 SH 27X BRD (SUTURE) IMPLANT
SUT VIC AB 4-0 PS2 18 (SUTURE) ×4 IMPLANT
SUT VLOC 180 0 9IN  GS21 (SUTURE) ×4
SUT VLOC 180 0 9IN GS21 (SUTURE) IMPLANT
SYR 10ML LL (SYRINGE) ×1 IMPLANT
SYR 5ML LL (SYRINGE) ×1 IMPLANT
TRAP SPECIMEN MUCUS 40CC (MISCELLANEOUS) ×2 IMPLANT
TRAY FOL W/BAG SLVR 16FR STRL (SET/KITS/TRAYS/PACK) ×1 IMPLANT
TRAY FOLEY W/BAG SLVR 16FR LF (SET/KITS/TRAYS/PACK) ×2
TROCAR BLADELESS OPT 12M 100M (ENDOMECHANICALS) IMPLANT
TUBE CONNECTING 12X1/4 (SUCTIONS) ×2 IMPLANT
UNDERPAD 30X36 HEAVY ABSORB (UNDERPADS AND DIAPERS) ×2 IMPLANT
WATER STERILE IRR 1000ML POUR (IV SOLUTION) ×2 IMPLANT
YANKAUER SUCT BULB TIP NO VENT (SUCTIONS) IMPLANT

## 2021-02-22 NOTE — Transfer of Care (Signed)
Immediate Anesthesia Transfer of Care Note  Patient: Pamela [name]  Procedure(s) Performed: Procedure(s) (LRB): XI ROBOTIC ASSISTED TOTAL HYSTERECTOMY WITH BILATERAL SALPINGO OOPHORECTOMY (Bilateral)  Patient Location: PACU  Anesthesia Type: General  Level of Consciousness: awake, alert  and oriented  Airway & Oxygen Therapy: Patient Spontanous Breathing and Patient connected to nasal cannula oxygen  Post-op Assessment: Report given to PACU RN and Post -op Vital signs reviewed and stable  Post vital signs: Reviewed and stable  Complications: No apparent anesthesia complications  Last Vitals:  Vitals Value Taken Time  BP 138/87 02/22/21 0942  Temp    Pulse    Resp 19 02/22/21 0943  SpO2    Vitals shown include unvalidated device data.  Last Pain:  Vitals:   02/22/21 0629  PainSc: 0-No pain      Patients Stated Pain Goal: 5 (60/63/01 6010)  Complications: No notable events documented.

## 2021-02-22 NOTE — H&P (Signed)
Consult Note: Gyn-Onc  Consult was requested by Dr. Lindi Adie for the evaluation of Pamela [name] 45 y.o. female  CC:  No chief complaint on file.   Assessment/Plan:  Pamela. Pamela [name]  is a 45 y.o.  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  I explained that current recommendations are for risk reducing BSO and consideration of hysterectomy at or after age 45.  Alternatives to his reducing surgery would be 6 monthly ultrasounds and Ca1 25 assessments, though these are limited by poor sensitivity and specificity and lack of documented associated risk reduction in ovarian cancer development or stage shifting to more favorable prognostic stages.  Patient is interested in pursuing risk reducing hysterectomy and BSO.   I have reviewed the plan for surgery, the outpatient surgical stay, and anticipated convalescence.  I expect she will require 2 to 4 weeks time off of work depending on her heavy lifting responsibilities at work. surgical procedure.  She has a history of abnormal Pap smears and a procedure either biopsy or LEEP at [facility] in 2017.  I explained to the patient that she continues to have an increased risk for vaginal cancer even after hysterectomy with removal of the cervix.  Therefore she will require surveillance Paps with her gynecologist or PCP for at least 20 years after completion hysterectomy.   HPI: Pamela [name] is a 45 year old P3 who was seen in consultation at the request of Dr Lindi Adie for evaluation of a deleterious mutation in [place].  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  It was ER and PR positive HER-2 negative.  She  has a strong family history for breast cancer including her sister who is BRCA1 positive is a possibility that her maternal grandmother had ovarian cancer that this is unclear.  Otherwise there is no apparent ovarian cancer in her family tree.  She had planned treatment for her breast cancer diagnosis with mastectomy and expander placement followed by delayed placement of implants in May 2022.   Her surgical history is most significant for tubal ligation.  Her gynecologic history is remarkable for an abnormal Pap smear in 2017 for which she underwent a colposcopic procedure at [facility], which sounds like cervical biopsy.  Is unclear if she had required excisional procedure following this.   Pap smear on 10/06/20 was normal with negative high risk HPV.   Her family cancer history is significant for multiple family members on the maternal side with breast cancer at young age.  She works as a Marine scientist in [employer].   Current Meds:  Outpatient Encounter Medications as of 10/06/2020  Medication Sig   cyanocobalamin 1000 MCG tablet Take by mouth.   FLUoxetine (PROZAC) 40 MG capsule Take 40 mg by mouth at bedtime.   nitrofurantoin, macrocrystal-monohydrate, (MACROBID) 100 MG capsule Use 1 tab po x 1 after intercourse   [DISCONTINUED] methocarbamol (ROBAXIN) 500 MG tablet Take 500 mg by mouth 4 (four) times daily.   [DISCONTINUED] mirtazapine (REMERON) 7.5 MG tablet mirtazapine 7.5 mg tablet   No facility-administered encounter medications on file as of 10/06/2020.    Allergy:  Allergies  Allergen Reactions   Metronidazole Swelling and Other (  See Comments)    Itching around lips and mouth Swollen Lips    Sulfamethoxazole-Trimethoprim Diarrhea and Nausea Only   Baclofen Nausea Only   Chlorhexidine Rash   Wound Dressing Adhesive Rash    Dermabond    Social Hx:   Social History   Socioeconomic History   Marital status: Married    Spouse name: Azhia Siefken   Number of children: 3   Years  of education: Not on file   Highest education level: Not on file  Occupational History   Occupation: [employer]  Tobacco Use   Smoking status: Former    Packs/day: 0.12    Years: 5.00    Pack years: 0.60    Types: Cigarettes   Smokeless tobacco: Never   Tobacco comments:    quit 15 years ago  Vaping Use   Vaping Use: Never used  Substance and Sexual Activity   Alcohol use: Yes    Alcohol/week: 2.0 standard drinks    Types: 2 Glasses of wine per week    Comment: occ   Drug use: Never   Sexual activity: Yes    Birth control/protection: Surgical  Other Topics Concern   Not on file  Social History Narrative   ** Merged History Encounter **       Social Determinants of Health   Financial Resource Strain: Not on file  Food Insecurity: Not on file  Transportation Needs: Not on file  Physical Activity: Not on file  Stress: Not on file  Social Connections: Not on file  Intimate Partner Violence: Not on file    Past Surgical Hx:  Past Surgical History:  Procedure Laterality Date   BREAST RECONSTRUCTION WITH PLACEMENT OF TISSUE EXPANDER AND ALLODERM Bilateral 10/12/2020   Procedure: BILATERAL BREAST RECONSTRUCTION WITH PLACEMENT OF TISSUE EXPANDER AND ALLODERM;  Surgeon: Irene Limbo, MD;  Location: [place];  Service: Plastics;  Laterality: Bilateral;   CERVICAL DISC SURGERY  2017   ruptured disc neck with prosthetic replacement   GANGLION CYST EXCISION Left 2019   left wrist yrs   LIPOSUCTION WITH LIPOFILLING Bilateral 01/06/2021   Procedure: LIPOFILLING FROM ABDOMEN TO BILATERAL CHEST;  Surgeon: Irene Limbo, MD;  Location: [place];  Service: Plastics;  Laterality: Bilateral;   NIPPLE SPARING MASTECTOMY WITH SENTINEL LYMPH NODE BIOPSY Bilateral 10/12/2020   Procedure: BILATERAL NIPPLE SPARING MASTECTOMY WITH RIGHT AXILLARY SENTINEL LYMPH NODE BIOPSY;  Surgeon: Rolm Bookbinder, MD;  Location: [place];  Service: General;  Laterality: Bilateral;  PEC BLOCK, RNFA   REMOVAL OF BILATERAL TISSUE EXPANDERS WITH PLACEMENT OF BILATERAL BREAST IMPLANTS Bilateral 01/06/2021   Procedure: REMOVAL OF BILATERAL TISSUE EXPANDERS WITH PLACEMENT OF BILATERAL BREAST SILICONE IMPLANTS;  Surgeon: Irene Limbo, MD;  Location: [place];  Service: Plastics;  Laterality: Bilateral;   TUBAL LIGATION     yrs ago    Past Medical Hx:  Past Medical History:  Diagnosis Date   BRCA1 gene mutation positive in female    Cancer [facility]) 2021   right   Family history of BRCA1 gene positive    Family history of breast cancer    Family history of colon cancer    Family history of prostate cancer    GERD (gastroesophageal reflux disease)    Pneumonia    2015 or 2016   PONV (postoperative nausea and vomiting)    Wears glasses    for reading    Past Gynecological History:  See HPI, hx of  abnormal paps, hx of SVD x 3. Most recent abnormal pap in 2017. Irregular bleeding treated with OCPs (discontinued in January, 2022 with development of breast cancer). Patient's last menstrual period was 02/03/2021.  Family Hx:  Family History  Problem Relation Age of Onset   Breast cancer Mother        dx. in her 76s   Breast cancer Sister 51       BRCA1 positive: c.[id number]>G (Intronic)   Cancer Maternal Aunt        unconfirmed, patient suspects that she died from cancer younger than 14   Breast cancer Maternal Grandmother        bilateral   Cancer Maternal Grandmother        gynecologic   Prostate cancer Maternal Uncle        dx. in his 31s   Colon cancer Paternal Aunt        died in her early 55s   Breast cancer Cousin 44       maternal first cousin   Breast cancer Cousin        dx. in her 31s, paternal first cousin    Review of Systems:  Constitutional  Feels well,    ENT Normal appearing ears and nares bilaterally Skin/Breast  No rash, sores, jaundice, itching,  dryness Cardiovascular  No chest pain, shortness of breath, or edema  Pulmonary  No cough or wheeze.  Gastro Intestinal  No nausea, vomitting, or diarrhoea. No bright red blood per rectum, no abdominal pain, change in bowel movement, or constipation.  Genito Urinary  No frequency, urgency, dysuria, + irregular bleeding and spotting. Musculo Skeletal  No myalgia, arthralgia, joint swelling or pain  Neurologic  No weakness, numbness, change in gait,  Psychology  No depression, anxiety, insomnia.   Vitals:  Blood pressure (!) 138/95, temperature 98.6 F (37 C), resp. rate (!) 78, height 5' 7"  (1.702 m), weight 153 lb 1.6 oz (69.4 kg), last menstrual period 02/03/2021, SpO2 100 %.  Physical Exam: WD in NAD Neck  Supple NROM, without any enlargements.  Lymph Node Survey No cervical supraclavicular or inguinal adenopathy Cardiovascular  Well perfused peripheries Lungs  No increased WOB Skin  No rash/lesions/breakdown  Psychiatry  Alert and oriented to person, place, and time  Abdomen  Normoactive bowel sounds, abdomen soft, non-tender and thin without evidence of hernia.  Back No CVA tenderness Genito Urinary  Vulva/vagina: Normal external female genitalia.   No lesions. No discharge or bleeding.  Bladder/urethra:  No lesions or masses, well supported bladder  Vagina: norma  Cervix: Normal appearing, no lesions. Pap taken  Uterus:  Slightly bulky, mobile, no parametrial involvement or nodularity.  Adnexa: no palpable masses. Rectal  deferred Extremities  No bilateral cyanosis, clubbing or edema.   Thereasa Solo, MD  02/22/2021, 7:16 AM

## 2021-02-22 NOTE — Anesthesia Procedure Notes (Signed)
Procedure Name: Intubation Date/Time: 02/22/2021 7:47 AM Performed by: Mechele Claude, CRNA Pre-anesthesia Checklist: Patient identified, Emergency Drugs available, Suction available and Patient being monitored Patient Re-evaluated:Patient Re-evaluated prior to induction Oxygen Delivery Method: Circle system utilized Preoxygenation: Pre-oxygenation with 100% oxygen Induction Type: IV induction Ventilation: Mask ventilation without difficulty Laryngoscope Size: Mac and 3 Grade View: Grade I Tube type: Oral Tube size: 7.0 mm Number of attempts: 1 Airway Equipment and Method: Stylet and Oral airway Placement Confirmation: ETT inserted through vocal cords under direct vision, positive ETCO2 and breath sounds checked- equal and bilateral Secured at: 21 (at teeth) cm Tube secured with: Tape Dental Injury: Teeth and Oropharynx as per pre-operative assessment  Comments: Very limited mouth opening after induction. Able to just slide blade into mouth for AOI.

## 2021-02-22 NOTE — Discharge Instructions (Addendum)
02/22/2021  Return to work: 4-6 weeks if applicable  The steri strips over your incisions should peel off in around one week.  Do not resume your tamoxifen until one week after surgery.  Activity: 1. Be up and out of the bed during the day.  Take a nap if needed.  You may walk up steps but be careful and use the hand rail.  Stair climbing will tire you more than you think, you may need to stop part way and rest.   2. No lifting or straining for 6 weeks.  3. No driving for 1 week(s).  Do not drive if you are taking narcotic pain medicine.  4. Shower daily.  Use your regular soap and water and pat your incisions dry; don't rub.  No tub baths until cleared by your surgeon.   5. No sexual activity and nothing in the vagina for 8 weeks.  6. You may experience a small amount of clear drainage from your incisions, which is normal.  If the drainage persists or increases, please call the office.  7. You may experience vaginal spotting after surgery or around the 6-8 week mark from surgery when the stitches at the top of the vagina begin to dissolve.  The spotting is normal but if you experience heavy bleeding, call our office.  8. Take Tylenol or ibuprofen first for pain and only use narcotic pain medication for severe pain not relieved by the Tylenol or Ibuprofen.  Monitor your Tylenol intake to a max of 4,000 mg.  Diet: 1. Low sodium Heart Healthy Diet is recommended.  2. It is safe to use a laxative, such as Miralax or Colace, if you have difficulty moving your bowels. You can take Sennakot at bedtime every evening to keep bowel movements regular and to prevent constipation.    Wound Care: 1. Keep clean and dry.  Shower daily.  Reasons to call the Doctor: Fever - Oral temperature greater than 100.4 degrees Fahrenheit Foul-smelling vaginal discharge Difficulty urinating Nausea and vomiting Increased pain at the site of the incision that is unrelieved with pain medicine. Difficulty  breathing with or without chest pain New calf pain especially if only on one side Sudden, continuing increased vaginal bleeding with or without clots.   Contacts: For questions or concerns you should contact:  Dr. Everitt Amber at [phone]  Joylene John, NP at [phone]  After Hours: call [phone] and have the GYN Oncologist paged/contacted   Post Anesthesia Home Care Instructions  Activity: Get plenty of rest for the remainder of the day. A responsible individual must stay with you for 24 hours following the procedure.  For the next 24 hours, DO NOT: -Drive a car -Paediatric nurse -Drink alcoholic beverages -Take any medication unless instructed by your physician -Make any legal decisions or sign important papers.  Meals: Start with liquid foods such as gelatin or soup. Progress to regular foods as tolerated. Avoid greasy, spicy, heavy foods. If nausea and/or vomiting occur, drink only clear liquids until the nausea and/or vomiting subsides. Call your physician if vomiting continues.  Special Instructions/Symptoms: Your throat may feel dry or sore from the anesthesia or the breathing tube placed in your throat during surgery. If this causes discomfort, gargle with warm salt water. The discomfort should disappear within 24 hours.  If you had a scopolamine patch placed behind your ear for the management of post- operative nausea and/or vomiting:  1. The medication in the patch is effective for 72 hours, after which it  should be removed.  Wrap patch in a tissue and discard in the trash. Wash hands thoroughly with soap and water. 2. You may remove the patch earlier than 72 hours if you experience unpleasant side effects which may include dry mouth, dizziness or visual disturbances. 3. Avoid touching the patch. Wash your hands with soap and water after contact with the patch. Remove patch on Friday.

## 2021-02-22 NOTE — Anesthesia Postprocedure Evaluation (Signed)
Anesthesia Post Note  Patient: TESSY PAWELSKI  Procedure(s) Performed: XI ROBOTIC ASSISTED TOTAL HYSTERECTOMY WITH BILATERAL SALPINGO OOPHORECTOMY (Bilateral: Abdomen)     Patient location during evaluation: PACU Anesthesia Type: General Level of consciousness: awake and alert Pain management: pain level controlled Vital Signs Assessment: post-procedure vital signs reviewed and stable Respiratory status: spontaneous breathing, nonlabored ventilation and respiratory function stable Cardiovascular status: blood pressure returned to baseline and stable Postop Assessment: no apparent nausea or vomiting Anesthetic complications: no   No notable events documented.  Last Vitals:  Vitals:   02/22/21 1115 02/22/21 1220  BP: (!) 91/57 126/82  Pulse: 64 78  Resp: 10 16  Temp: 36.5 C 36.6 C  SpO2: 93% 99%    Last Pain:  Vitals:   02/22/21 1220  PainSc: 2                  Zondra Lawlor,W. EDMOND

## 2021-02-22 NOTE — Op Note (Signed)
OPERATIVE NOTE 02/22/21  Surgeon: Donaciano Eva   Assistants: Joylene John, NP (a provider assistant was necessary for tissue manipulation, management of robotic instrumentation, retraction and positioning due to the complexity of the case and hospital policies).   Anesthesia: General endotracheal anesthesia  ASA Class: 3   Pre-operative Diagnosis:  deleterious mutation in BRCA 1, history of breast cancer  Post-operative Diagnosis:  same  Operation: Robotic-assisted laparoscopic total hysterectomy with bilateral salpingoophorectomy   Surgeon: Donaciano Eva  Assistant Surgeon: Joylene John, NP  Anesthesia: GET  Urine Output: 250cc  Operative Findings:  : 7cm normal appearing uterus, normal tubes and ovaries bilaterally.  Estimated Blood Loss:   25cc       Total IV Fluids: 800 ml         Specimens:  washings, uterus with cervix and bilateral tubes and ovaries         Complications:  None; patient tolerated the procedure well.         Disposition: PACU - hemodynamically stable.  Procedure Details  The patient was seen in the Holding Room. The risks, benefits, complications, treatment options, and expected outcomes were discussed with the patient.  The patient concurred with the proposed plan, giving informed consent.  The site of surgery properly noted/marked. The patient was identified as Pamela [name] and the procedure verified as a Robotic-assisted hysterectomy with bilateral salpingo oophorectomy. A Time Out was held and the above information confirmed.  After induction of anesthesia, the patient was draped and prepped in the usual sterile manner. Pt was placed in supine position after anesthesia and draped and prepped in the usual sterile manner. The abdominal drape was placed after the DuraPrep had been allowed to dry for 3 minutes.  Her arms were tucked to her side with all appropriate precautions.  The shoulders were stabilized with padded shoulder blocks  applied to the acromium processes.  The patient was placed in the semi-lithotomy position in [place].  The perineum and vagina was prepped with Betadine. The patient was then prepped. Foley catheter was placed.  A sterile speculum was placed in the vagina.  The cervix was grasped with a single-tooth tenaculum and dilated with Kennon Rounds dilators.  The ZUMI uterine manipulator with a medium colpotomizer ring was placed without difficulty.  A pneum occluder balloon was placed over the manipulator.  OG tube placement was confirmed and to suction.   Next, a 5 mm skin incision was made 1 cm below the subcostal margin in the midclavicular line.  The 5 mm Optiview port and scope was used for direct entry.  Opening pressure was under 10 mm CO2.  The abdomen was insufflated and the findings were noted as above.   At this point and all points during the procedure, the patient's intra-abdominal pressure did not exceed 15 mmHg. Next, a 10 mm skin incision was made in the umbilicus and a right and left port was placed about 10 cm lateral to the robot port on the right and left side.  All ports were placed under direct visualization.  The patient was placed in steep Trendelenburg.  Bowel was folded away into the upper abdomen.  The robot was docked in the normal manner.  The hysterectomy was started after the round ligament on the right side was incised and the retroperitoneum was entered and the pararectal space was developed.  The ureter was noted to be on the medial leaf of the broad ligament.  The peritoneum above the ureter was incised  and stretched and the infundibulopelvic ligament was skeletonized, cauterized and cut.  The posterior peritoneum was taken down to the level of the KOH ring.  The anterior peritoneum was also taken down.  The bladder flap was created to the level of the KOH ring.  The uterine artery on the right side was skeletonized, cauterized and cut in the normal manner.  A similar procedure was  performed on the left.  The colpotomy was made and the uterus, cervix, bilateral ovaries and tubes were amputated and delivered through the vagina.  Pedicles were inspected and excellent hemostasis was achieved.    The colpotomy at the vaginal cuff was closed with Vicryl on a CT1 needle in figure of 8's at the corners and a v-loc in a running manner in a 2 layer closure across the cuff.  Irrigation was used and excellent hemostasis was achieved.  At this point in the procedure was completed.  Robotic instruments were removed under direct visulaization.  The robot was undocked. The 10 mm ports were closed with Vicryl on a UR-5 needle and the fascia was closed with 0 Vicryl on a UR-5 needle.  The skin was closed with 4-0 Vicryl in a subcuticular manner.  Dermabond was applied.  Sponge, lap and needle counts correct x 2.  The patient was taken to the recovery room in stable condition.  The vagina was swabbed with  minimal bleeding noted.   All instrument and needle counts were correct x  3.   The patient was transferred to the recovery room in a stable condition.  Donaciano Eva, MD

## 2021-02-23 ENCOUNTER — Telehealth: Payer: Self-pay

## 2021-02-23 ENCOUNTER — Other Ambulatory Visit: Payer: Self-pay | Admitting: Gynecologic Oncology

## 2021-02-23 DIAGNOSIS — Z1509 Genetic susceptibility to other malignant neoplasm: Secondary | ICD-10-CM

## 2021-02-23 LAB — SURGICAL PATHOLOGY

## 2021-02-23 MED ORDER — SENNOSIDES-DOCUSATE SODIUM 8.6-50 MG PO TABS: 2.0000 | ORAL_TABLET | Freq: Every day | ORAL | 0 refills | Status: AC

## 2021-02-23 NOTE — Telephone Encounter (Signed)
LM for Pamela [name] to call back to the office tomorrow 02-24-21 to discuss the results of her surgical pathology. When she call back she can be told that no cancer was seen per Zoila Shutter

## 2021-02-23 NOTE — Telephone Encounter (Signed)
Spoke with Pamela [name] this morning. She states she is eating, drinking and urinating well. She has not had a BM yet but is passing gas. She is taking miralax but is going to try taking senokot instead. Encouraged her to drink plenty of water. She denies fever or chills. Incisions are dry and intact. Her pain is controlled with Ibuprofen and Oxycodone.   Instructed to call office with any fever, chills, purulent drainage, uncontrolled pain or any other questions or concerns. Patient verbalizes understanding.   Pt aware of post op appointments as well as the office number [phone] and after hours number [phone] to call if she has any questions or concerns

## 2021-02-24 ENCOUNTER — Encounter (HOSPITAL_BASED_OUTPATIENT_CLINIC_OR_DEPARTMENT_OTHER): Payer: Self-pay | Admitting: Gynecologic Oncology

## 2021-02-24 LAB — CYTOLOGY - NON PAP

## 2021-02-24 NOTE — Telephone Encounter (Signed)
Left message for Ms. Doyon following up with her regarding her surgical pathology results. Instructed to call the office.

## 2021-02-28 NOTE — Telephone Encounter (Signed)
Told [name] that the final surgical pathology showed no cancer per Melissa Cross,NP. Pt verbalized understanding.

## 2021-03-03 ENCOUNTER — Other Ambulatory Visit: Payer: Self-pay | Admitting: Hematology and Oncology

## 2021-03-03 ENCOUNTER — Telehealth: Payer: Self-pay

## 2021-03-03 ENCOUNTER — Other Ambulatory Visit: Payer: Self-pay

## 2021-03-03 ENCOUNTER — Inpatient Hospital Stay: Payer: BC Managed Care – PPO | Attending: Gynecologic Oncology

## 2021-03-03 DIAGNOSIS — C50311 Malignant neoplasm of lower-inner quadrant of right female breast: Secondary | ICD-10-CM | POA: Insufficient documentation

## 2021-03-03 DIAGNOSIS — Z90722 Acquired absence of ovaries, bilateral: Secondary | ICD-10-CM | POA: Diagnosis not present

## 2021-03-03 DIAGNOSIS — Z90710 Acquired absence of both cervix and uterus: Secondary | ICD-10-CM | POA: Diagnosis not present

## 2021-03-03 DIAGNOSIS — B373 Candidiasis of vulva and vagina: Secondary | ICD-10-CM | POA: Insufficient documentation

## 2021-03-03 DIAGNOSIS — Z148 Genetic carrier of other disease: Secondary | ICD-10-CM | POA: Diagnosis present

## 2021-03-03 DIAGNOSIS — R300 Dysuria: Secondary | ICD-10-CM

## 2021-03-03 DIAGNOSIS — Z1509 Genetic susceptibility to other malignant neoplasm: Secondary | ICD-10-CM | POA: Insufficient documentation

## 2021-03-03 DIAGNOSIS — Z1502 Genetic susceptibility to malignant neoplasm of ovary: Secondary | ICD-10-CM | POA: Diagnosis not present

## 2021-03-03 DIAGNOSIS — Z1501 Genetic susceptibility to malignant neoplasm of breast: Secondary | ICD-10-CM | POA: Diagnosis not present

## 2021-03-03 LAB — URINALYSIS, COMPLETE (UACMP) WITH MICROSCOPIC
Bacteria, UA: NONE SEEN
Bilirubin Urine: NEGATIVE
Glucose, UA: NEGATIVE mg/dL
Hgb urine dipstick: NEGATIVE
Ketones, ur: NEGATIVE mg/dL
Leukocytes,Ua: NEGATIVE
Nitrite: NEGATIVE
Protein, ur: NEGATIVE mg/dL
Specific Gravity, Urine: 1.025 (ref 1.005–1.030)
pH: 6 (ref 5.0–8.0)

## 2021-03-03 MED ORDER — VENLAFAXINE HCL ER 37.5 MG PO CP24: 37.5000 mg | ORAL_CAPSULE | Freq: Every day | ORAL | 6 refills | Status: DC

## 2021-03-03 NOTE — Progress Notes (Signed)
Severe hot flashes since hysterectomy: I would like to get her started on Effexor XR 37.5 mg. We will do a virtual visit in 4 weeks to assess her symptoms and if necessary we can go up on the dose. She currently takes Prozac and she will start tapering it down and discontinue making it in a week.

## 2021-03-03 NOTE — Telephone Encounter (Signed)
Told Ms Proctor that the urinalysis looked good. It does not indicate a urinary tract infection.  Will wait for the urine culture results. It takes 3 days for this to result. Will call her when results available. Melissa Cross,NP said that she can try AZO OTC for her symptoms. Pt verbalized understanding.

## 2021-03-03 NOTE — Telephone Encounter (Signed)
Told Ms Devin that her hot flashes were discussed with Dr. Lindi Adie. He sent in Effexor 37.5 mg XR tablet to take at bedtime. She may experience a dull feeling in her head the first few days of taking the medication. It takes ~3 weeks for the medication to work.  She can call Dr. Geralyn Flash nurse to see if medication needs to be increased if she does not feel her hot flashes are improving. Pt verbalized understanding.

## 2021-03-03 NOTE — Telephone Encounter (Signed)
Pamela [name] states that she urinary frequency, urgency,burning with urination. Onset 3 days ago.  She had surgery on 02-22-21. Pt to come in for urine sample for U/A; C&S today at 1 pm.  She also states that she has been experiencing hot flashes.

## 2021-03-04 LAB — URINE CULTURE

## 2021-03-06 NOTE — Telephone Encounter (Signed)
Pamela [name] states that she is afebrile. She continues to experiences pressure with urination and also with a BM. She feels a constant pressure in her bottom. No vaginal drainage. She does feel she is emptying her bladder with urination. She continues with urinary urgency and frequency.  The AZO is helpful with symptoms. Reviewed with Melissa Cross,NP. She will discuss with Dr. Denman George and will let her know Dr. Serita Grit recommendations. Pt to call if symptoms get worse. Pt verbalized understanding.

## 2021-03-08 NOTE — Telephone Encounter (Signed)
Told Pamela [name] that Dr. Denman George stated that she does not need any intervention at this time. Continue with the AZO. Pt verbalized understanding.

## 2021-03-15 ENCOUNTER — Inpatient Hospital Stay (HOSPITAL_BASED_OUTPATIENT_CLINIC_OR_DEPARTMENT_OTHER): Payer: BC Managed Care – PPO | Admitting: Gynecologic Oncology

## 2021-03-15 ENCOUNTER — Other Ambulatory Visit: Payer: Self-pay

## 2021-03-15 VITALS — BP 135/102 | HR 88 | Temp 97.8°F | Ht 67.0 in | Wt 149.0 lb

## 2021-03-15 DIAGNOSIS — B373 Candidiasis of vulva and vagina: Secondary | ICD-10-CM

## 2021-03-15 DIAGNOSIS — B3731 Acute candidiasis of vulva and vagina: Secondary | ICD-10-CM

## 2021-03-15 DIAGNOSIS — Z1509 Genetic susceptibility to other malignant neoplasm: Secondary | ICD-10-CM

## 2021-03-15 DIAGNOSIS — Z148 Genetic carrier of other disease: Secondary | ICD-10-CM

## 2021-03-15 DIAGNOSIS — Z1501 Genetic susceptibility to malignant neoplasm of breast: Secondary | ICD-10-CM

## 2021-03-15 DIAGNOSIS — Z1502 Genetic susceptibility to malignant neoplasm of ovary: Secondary | ICD-10-CM

## 2021-03-15 DIAGNOSIS — Z90722 Acquired absence of ovaries, bilateral: Secondary | ICD-10-CM

## 2021-03-15 DIAGNOSIS — Z90710 Acquired absence of both cervix and uterus: Secondary | ICD-10-CM

## 2021-03-15 MED ORDER — FLUCONAZOLE 100 MG PO TABS: 150.0000 mg | ORAL_TABLET | Freq: Once | ORAL | 0 refills | Status: AC

## 2021-03-15 NOTE — Progress Notes (Signed)
Follow-up Note: Gyn-Onc  Consult was requested by Dr. Lindi Adie for the evaluation of Pamela [name] 45 y.o. female  CC:  Chief Complaint  Patient presents with   BRCA1 gene mutation positive in female     Assessment/Plan:  Pamela [name]  is a 45 y.o.  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  She will follow-up with her gynecologists at [facility] for routine care.  I have prescribed her diflucan for a yeast infection.   She has a history of abnormal Pap smears and a procedure either biopsy or LEEP at [facility] in 2017.  We repeated Pap cytology and HPV testing today.  I explained to the patient that she continues to have an increased risk for vaginal cancer even after hysterectomy with removal of the cervix.  Therefore she will require surveillance Paps with her gynecologist or PCP for at least 20 years after completion hysterectomy.   HPI: Ms Pamela [name] is a 45 year old P3 who was seen in consultation at the request of Dr Lindi Adie for evaluation of a deleterious mutation in [place].  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  It was ER and PR positive HER-2 negative.  She has a strong family history for breast cancer including her sister who is BRCA1 positive is a possibility that her maternal grandmother had ovarian cancer that this is unclear.  Otherwise there is no apparent ovarian cancer in her family tree.  She had planned treatment for her breast cancer diagnosis with mastectomy and expander placement followed by delayed placement of implants.  Interval Hx:  On 02/22/21 she underwent robotic assisted total hysterectomy, BSO. Intraoperative findings were significant for a normal appearing uterus, tubes and ovaries. Surgery was uncomplicated.  Final pathology revealed benign pathology in all specimens  including washings.  Since surgery she has done well with the exception of symptoms of a yeast infection.   Current Meds:  Outpatient Encounter Medications as of 03/15/2021  Medication Sig   fluconazole (DIFLUCAN) 100 MG tablet Take 1.5 tablets (150 mg total) by mouth once for 1 dose.   albuterol (VENTOLIN HFA) 108 (90 Base) MCG/ACT inhaler Inhale 2 puffs into the lungs.   esomeprazole (NEXIUM) 40 MG capsule Take 40 mg by mouth.   hydrOXYzine (ATARAX/VISTARIL) 25 MG tablet Take by mouth.   ibuprofen (ADVIL) 800 MG tablet Take 1 tablet (800 mg total) by mouth every 8 (eight) hours as needed for moderate pain. For AFTER surgery only   mirtazapine (REMERON) 7.5 MG tablet Take 7.5 mg by mouth at bedtime.   omeprazole (PRILOSEC) 20 MG capsule Take 20 mg by mouth as needed.   ondansetron (ZOFRAN) 8 MG tablet Take 1 tablet (8 mg total) by mouth every 8 (eight) hours as needed for nausea or vomiting. For AFTER surgery   oxyCODONE (OXY IR/ROXICODONE) 5 MG immediate release tablet Take 1 tablet (5 mg total) by mouth every 4 (four) hours as needed for severe pain. For AFTER surgery only, do not take and drive   senna-docusate (SENOKOT-S) 8.6-50 MG tablet Take 2 tablets by mouth at bedtime. Do not take if having diarrhea   venlafaxine XR (EFFEXOR-XR) 37.5 MG 24 hr capsule Take 1 capsule (37.5 mg total) by mouth at bedtime.   No facility-administered encounter medications on file as of 03/15/2021.  Allergy:  Allergies  Allergen Reactions   Metronidazole Swelling and Other (See Comments)    Itching around lips and mouth Swollen Lips    Sulfamethoxazole-Trimethoprim Diarrhea and Nausea Only   Baclofen Nausea Only   Chlorhexidine Rash   Wound Dressing Adhesive Rash    Dermabond    Social Hx:   Social History   Socioeconomic History   Marital status: Married    Spouse name: Makeba Delcastillo   Number of children: 3   Years of education: Not on file   Highest education level: Not on file   Occupational History   Occupation: [employer]  Tobacco Use   Smoking status: Former    Packs/day: 0.12    Years: 5.00    Pack years: 0.60    Types: Cigarettes   Smokeless tobacco: Never   Tobacco comments:    quit 15 years ago  Vaping Use   Vaping Use: Never used  Substance and Sexual Activity   Alcohol use: Yes    Alcohol/week: 2.0 standard drinks    Types: 2 Glasses of wine per week    Comment: occ   Drug use: Never   Sexual activity: Yes    Birth control/protection: Surgical  Other Topics Concern   Not on file  Social History Narrative   ** Merged History Encounter **       Social Determinants of Health   Financial Resource Strain: Not on file  Food Insecurity: Not on file  Transportation Needs: Not on file  Physical Activity: Not on file  Stress: Not on file  Social Connections: Not on file  Intimate Partner Violence: Not on file    Past Surgical Hx:  Past Surgical History:  Procedure Laterality Date   BREAST RECONSTRUCTION WITH PLACEMENT OF TISSUE EXPANDER AND ALLODERM Bilateral 10/12/2020   Procedure: BILATERAL BREAST RECONSTRUCTION WITH PLACEMENT OF TISSUE EXPANDER AND ALLODERM;  Surgeon: Irene Limbo, MD;  Location: [place];  Service: Plastics;  Laterality: Bilateral;   CERVICAL DISC SURGERY  2017   ruptured disc neck with prosthetic replacement   GANGLION CYST EXCISION Left 2019   left wrist yrs   LIPOSUCTION WITH LIPOFILLING Bilateral 01/06/2021   Procedure: LIPOFILLING FROM ABDOMEN TO BILATERAL CHEST;  Surgeon: Irene Limbo, MD;  Location: [place];  Service: Plastics;  Laterality: Bilateral;   NIPPLE SPARING MASTECTOMY WITH SENTINEL LYMPH NODE BIOPSY Bilateral 10/12/2020   Procedure: BILATERAL NIPPLE SPARING MASTECTOMY WITH RIGHT AXILLARY SENTINEL LYMPH NODE BIOPSY;  Surgeon: Rolm Bookbinder, MD;  Location: [place];  Service: General;  Laterality: Bilateral;  PEC BLOCK, RNFA    REMOVAL OF BILATERAL TISSUE EXPANDERS WITH PLACEMENT OF BILATERAL BREAST IMPLANTS Bilateral 01/06/2021   Procedure: REMOVAL OF BILATERAL TISSUE EXPANDERS WITH PLACEMENT OF BILATERAL BREAST SILICONE IMPLANTS;  Surgeon: Irene Limbo, MD;  Location: [place];  Service: Plastics;  Laterality: Bilateral;   ROBOTIC ASSISTED TOTAL HYSTERECTOMY WITH BILATERAL SALPINGO OOPHERECTOMY Bilateral 02/22/2021   Procedure: XI ROBOTIC ASSISTED TOTAL HYSTERECTOMY WITH BILATERAL SALPINGO OOPHORECTOMY;  Surgeon: Everitt Amber, MD;  Location: [place];  Service: Gynecology;  Laterality: Bilateral;   TUBAL LIGATION     yrs ago    Past Medical Hx:  Past Medical History:  Diagnosis Date   BRCA1 gene mutation positive in female    Cancer [facility]) 2021   right   Family history of BRCA1 gene positive    Family history of breast cancer    Family history of colon cancer  Family history of prostate cancer    GERD (gastroesophageal reflux disease)    Pneumonia    2015 or 2016   PONV (postoperative nausea and vomiting)    Wears glasses    for reading    Past Gynecological History:  See HPI, hx of abnormal paps, hx of SVD x 3. Most recent abnormal pap in 2017. Irregular bleeding treated with OCPs (discontinued in January, 2022 with development of breast cancer). No LMP recorded. (Menstrual status: Irregular Periods).  Family Hx:  Family History  Problem Relation Age of Onset   Breast cancer Mother        dx. in her 38s   Breast cancer Sister 22       BRCA1 positive: c.[id number]>G (Intronic)   Cancer Maternal Aunt        unconfirmed, patient suspects that she died from cancer younger than 23   Breast cancer Maternal Grandmother        bilateral   Cancer Maternal Grandmother        gynecologic   Prostate cancer Maternal Uncle        dx. in his 72s   Colon cancer Paternal Aunt        died in her early 19s   Breast cancer Cousin 73       maternal first cousin   Breast  cancer Cousin        dx. in her 27s, paternal first cousin    Review of Systems:  Constitutional  Feels well,    ENT Normal appearing ears and nares bilaterally Skin/Breast  No rash, sores, jaundice, itching, dryness Cardiovascular  No chest pain, shortness of breath, or edema  Pulmonary  No cough or wheeze.  Gastro Intestinal  No nausea, vomitting, or diarrhoea. No bright red blood per rectum, no abdominal pain, change in bowel movement, or constipation.  Genito Urinary  No frequency, urgency, dysuria, + vaginal irritation Musculo Skeletal  No myalgia, arthralgia, joint swelling or pain  Neurologic  No weakness, numbness, change in gait,  Psychology  No depression, anxiety, insomnia.   Vitals:  Blood pressure (!) 135/102, pulse 88, temperature 97.8 F (36.6 C), temperature source Tympanic, height 5' 7"  (1.702 m), weight 149 lb (67.6 kg), SpO2 100 %.  Physical Exam: WD in NAD Neck  Supple NROM, without any enlargements.  Lymph Node Survey No cervical supraclavicular or inguinal adenopathy Cardiovascular  Well perfused peripheries Lungs  No increased WOB Skin  No rash/lesions/breakdown  Psychiatry  Alert and oriented to person, place, and time  Abdomen  Normoactive bowel sounds, abdomen soft, non-tender and thin without evidence of hernia. Incisions well healed.  Back No CVA tenderness Genito Urinary  Vaginal cuff in tact, no gapping, no bleeding Rectal  deferred Extremities  No bilateral cyanosis, clubbing or edema.   Thereasa Solo, MD  03/15/2021, 2:36 PM

## 2021-04-10 NOTE — Progress Notes (Signed)
HEMATOLOGY-ONCOLOGY MYCHART VIDEO VISIT PROGRESS NOTE  I connected with Pamela [name] on 04/11/2021 at 12:45 PM EDT by MyChart video conference and verified that I am speaking with the correct person using two identifiers.  I discussed the limitations, risks, security and privacy concerns of performing an evaluation and management service by MyChart and the availability of in person appointments.  I also discussed with the patient that there may be a patient responsible charge related to this service. The patient expressed understanding and agreed to proceed.  Patient's Location: Home Physician Location: Clinic  CHIEF COMPLIANT: Follow-up of breast cancer and BRCA1 mutation  INTERVAL HISTORY: Pamela [name] is a 45 y.o. female with above-mentioned history of breast cancer and BRCA1 mutation having undergone bilateral mastectomies with reconstruction. She presents to the clinic today to discuss the pathology report and further treatment.  She is complaining of profound hot flashes.  She was started on Effexor 37.5 mg and it appears to be helping her slightly.  She continues to have emotional and disturbances with tearfulness regularly.  Oncology History  Malignant neoplasm of lower-inner quadrant of right breast of female, estrogen receptor positive ([place])  09/07/2020 Initial Diagnosis   Palpable right axillary mass.  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    10/12/2020 Cancer Staging   Staging form: Breast, AJCC 8th Edition - Pathologic stage from 10/12/2020: Stage IA (pT1b, pN0, cM0, G1,  ER+, PR+, HER2-) - Signed by Gardenia Phlegm, NP on 02/13/2021 Stage prefix: Initial diagnosis Histologic grading system: 3 grade system   09/2020 -  Anti-estrogen oral therapy   Tamoxifen daily      I have reviewed the data as listed CMP Latest Ref Rng & Units 02/20/2021  Glucose 70 - 99 mg/dL 88  BUN 6 - 20 mg/dL 12  Creatinine 0.44 - 1.00 mg/dL 0.75  Sodium 135 - 145 mmol/L 137  Potassium 3.5 - 5.1 mmol/L 3.9  Chloride 98 - 111 mmol/L 106  CO2 22 - 32 mmol/L 24  Calcium 8.9 - 10.3 mg/dL 9.2    Lab Results  Component Value Date   WBC 4.7 02/20/2021   HGB 13.2 02/20/2021   HCT 40.3 02/20/2021   MCV 88.8 02/20/2021   PLT 269 02/20/2021      Assessment Plan:  Malignant neoplasm of lower-inner quadrant of right breast of female, estrogen receptor positive ([place]) 09/07/2020:Palpable right axillary mass.  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 Bilateral mastectomies and reconstruction: Rt axilla: Grade 1 , 0.7 cm IDC with DCIS, 0/4 LN Neg, Bil mastectomies: Benign , ER 95%, PR 95%, HER2 negative, Ki 67 15%  2. No need of Oncotype DX testing  3. Adjuvant antiestrogen therapy initially started on tamoxifen, switched to letrozole in 04/11/2021 after she had TAH/BSO   TAH/BSO: 01/2021: No malignancy     Recommend switching her to AI therapy  Letrozole counseling: We discussed  the risks and benefits of anti-estrogen therapy with aromatase inhibitors. These include but not limited to insomnia, hot flashes, mood changes, vaginal dryness, bone density loss, and weight gain. We strongly believe that the benefits far outweigh the risks. Patient understands these risks and consented to starting treatment. Planned treatment duration is 7 years.  Severe Hot flashes since hysterectomy: Started on Effexor 37.5 mg  03/03/21, it is still not helping her significantly therefore we increase the dosage to 75 mg daily.  Mychart visit in 3 months to assess tolerance to AI.     I discussed the assessment and treatment plan with the patient. The patient was provided an opportunity to ask questions and all were answered. The patient agreed with the plan and demonstrated an understanding of the instructions. The patient was advised to call back or seek an in-person evaluation if the symptoms worsen or if the condition fails to improve as anticipated.   Total time spent: 20 minutes including face-to-face MyChart video visit time and time spent for planning, charting and coordination of care  Rulon Eisenmenger, MD 04/11/2021  I, Thana Ates am acting as scribe for Nicholas Lose, MD.  I have reviewed the above documentation for accuracy and completeness, and I agree with the above.

## 2021-04-11 ENCOUNTER — Inpatient Hospital Stay: Payer: BC Managed Care – PPO | Attending: Gynecologic Oncology | Admitting: Hematology and Oncology

## 2021-04-11 DIAGNOSIS — Z170 Estrogen receptor positive status [ER+]: Secondary | ICD-10-CM

## 2021-04-11 DIAGNOSIS — C50311 Malignant neoplasm of lower-inner quadrant of right female breast: Secondary | ICD-10-CM | POA: Diagnosis not present

## 2021-04-11 MED ORDER — VENLAFAXINE HCL ER 75 MG PO CP24: 75.0000 mg | ORAL_CAPSULE | Freq: Every day | ORAL | 6 refills | Status: DC

## 2021-04-11 MED ORDER — LETROZOLE 2.5 MG PO TABS: 2.5000 mg | ORAL_TABLET | Freq: Every day | ORAL | 3 refills | Status: DC

## 2021-04-11 NOTE — Assessment & Plan Note (Signed)
09/07/2020:Palpable right axillary mass. Mammogram([facility]): 0.7 cm mass mid axilla right breast: Biopsy IDC with DCIS, grade 2, ER 85%, PR 96%, HER2 negative 1.4 cm mass in the left breast 4 o'clock position: Cyst BRCA 1 Mutation  Treatment Plan: 1. Bilateral mastectomies and reconstruction: Rt axilla: Grade 1 , 0.7 cm IDC with DCIS, 0/4 LN Neg, Bil mastectomies: Benign , ER 95%, PR 95%, HER2 negative, Ki 67 15%  2. No need of Oncotype DX testing  3. Adjuvant antiestrogen therapy  TAH/BSO: 01/2021: No malignancy  Tamoxifen Toxicities:  Recommend switching her to AI therapy  Letrozole counseling: We discussed the risks and benefits of anti-estrogen therapy with aromatase inhibitors. These include but not limited to insomnia, hot flashes, mood changes, vaginal dryness, bone density loss, and weight gain. We strongly believe that the benefits far outweigh the risks. Patient understands these risks and consented to starting treatment. Planned treatment duration is 7 years.  Severe Hot flashes since hysterectomy: Started on Effexor 37.5 mg 03/03/21 Mychart visit in 1 month to assess tolerance to AI.

## 2021-04-14 ENCOUNTER — Other Ambulatory Visit: Payer: Self-pay

## 2021-04-14 ENCOUNTER — Ambulatory Visit: Payer: BC Managed Care – PPO | Attending: General Surgery | Admitting: Physical Therapy

## 2021-04-14 DIAGNOSIS — Z483 Aftercare following surgery for neoplasm: Secondary | ICD-10-CM | POA: Insufficient documentation

## 2021-04-14 NOTE — Therapy (Signed)
[place], [place], [address] Phone: [phone]   Fax:  [phone]  Physical Therapy Treatment  Patient Details  Name: Pamela [name] MRN: [id number] Date of Birth: July 11, 1976 Referring Provider (PT): Dr. Donne Hazel   Encounter Date: 04/14/2021   PT End of Session - 04/14/21 0806     Visit Number 17    Number of Visits 18    PT Start Time 0801    PT Stop Time 0806    PT Time Calculation (min) 5 min             Past Medical History:  Diagnosis Date   BRCA1 gene mutation positive in female    Cancer [facility]) 2021   right   Family history of BRCA1 gene positive    Family history of breast cancer    Family history of colon cancer    Family history of prostate cancer    GERD (gastroesophageal reflux disease)    Pneumonia    2015 or 2016   PONV (postoperative nausea and vomiting)    Wears glasses    for reading    Past Surgical History:  Procedure Laterality Date   BREAST RECONSTRUCTION WITH PLACEMENT OF TISSUE EXPANDER AND ALLODERM Bilateral 10/12/2020   Procedure: BILATERAL BREAST RECONSTRUCTION WITH PLACEMENT OF TISSUE EXPANDER AND ALLODERM;  Surgeon: Irene Limbo, MD;  Location: [place];  Service: Plastics;  Laterality: Bilateral;   CERVICAL DISC SURGERY  2017   ruptured disc neck with prosthetic replacement   GANGLION CYST EXCISION Left 2019   left wrist yrs   LIPOSUCTION WITH LIPOFILLING Bilateral 01/06/2021   Procedure: LIPOFILLING FROM ABDOMEN TO BILATERAL CHEST;  Surgeon: Irene Limbo, MD;  Location: [place];  Service: Plastics;  Laterality: Bilateral;   NIPPLE SPARING MASTECTOMY WITH SENTINEL LYMPH NODE BIOPSY Bilateral 10/12/2020   Procedure: BILATERAL NIPPLE SPARING MASTECTOMY WITH RIGHT AXILLARY SENTINEL LYMPH NODE BIOPSY;  Surgeon: Rolm Bookbinder, MD;  Location: [place];  Service: General;  Laterality: Bilateral;   PEC BLOCK, RNFA   REMOVAL OF BILATERAL TISSUE EXPANDERS WITH PLACEMENT OF BILATERAL BREAST IMPLANTS Bilateral 01/06/2021   Procedure: REMOVAL OF BILATERAL TISSUE EXPANDERS WITH PLACEMENT OF BILATERAL BREAST SILICONE IMPLANTS;  Surgeon: Irene Limbo, MD;  Location: [place];  Service: Plastics;  Laterality: Bilateral;   ROBOTIC ASSISTED TOTAL HYSTERECTOMY WITH BILATERAL SALPINGO OOPHERECTOMY Bilateral 02/22/2021   Procedure: XI ROBOTIC ASSISTED TOTAL HYSTERECTOMY WITH BILATERAL SALPINGO OOPHORECTOMY;  Surgeon: Everitt Amber, MD;  Location: [place];  Service: Gynecology;  Laterality: Bilateral;   TUBAL LIGATION     yrs ago    There were no vitals filed for this visit.   Subjective Assessment - 04/14/21 0806     Subjective Pt is here for SOZO sreen    Pertinent History 10/12/20 bilateral nipple sparing mastectomy with R SLNB (4 nodes all negative) for treatment of R breast cancer that is ER/PR+,HER2-, BRCA 1 positive, plan is to have total hysterectomy on 02/22/21                    L-DEX FLOWSHEETS - 04/14/21 0800       L-DEX LYMPHEDEMA SCREENING   Measurement Type Unilateral    L-DEX MEASUREMENT EXTREMITY Upper Extremity    POSITION  Standing    DOMINANT SIDE Left    At Risk Side Left    BASELINE SCORE (UNILATERAL) 9.6    L-DEX SCORE (UNILATERAL) 14  PT Long Term Goals - 02/20/21 1205       PT LONG TERM GOAL #1   Title Pt will return to baseline ROM measurements and not demonstrate any signs/symptoms of lymphedema.    Status Partially Met      PT LONG TERM GOAL #2   Title Pt will demonstrate 170 degrees of bilateral shoulder flexion to allow pt to reach overhead.    Baseline R 101, L 75; 12/05/20- R 150, L 152; 01/30/21- R 155, L 165; Rt 157 (pull felt in axilla at end ROM) and Lt 150 degrees - 02/20/21    Status Not Met      PT LONG TERM GOAL #3   Title Pt will  demonstrate 170 degrees of bilateral shoulder abduction to allow her to reach out to the side    Baseline R 75, L 81; 12/05/20- R 122, L 111; 01/30/21- R 170, L 175; Rt 155 (pull felt in axilla) and Lt 164 degrees - 02/20/21    Status Not Met      PT LONG TERM GOAL #4   Title Pt will be independent in a home exercise program for continued strengthening and stretching.    Status Achieved                   Plan - 04/14/21 0807     Clinical Impression Statement Pt in recommended range of baseline for SOZO    PT Next Visit Plan Continue 3 month SOZO surveillance             Patient will benefit from skilled therapeutic intervention in order to improve the following deficits and impairments:     Visit Diagnosis: Aftercare following surgery for neoplasm     Problem List Patient Active Problem List   Diagnosis Date Noted   Breast cancer, right ([place]) 10/12/2020   History of cervical dysplasia 10/06/2020   Malignant neoplasm of lower-inner quadrant of right breast of female, estrogen receptor positive ([place]) 09/16/2020   BRCA1 gene mutation positive in female    Family history of BRCA1 gene positive    Family history of breast cancer    Family history of prostate cancer    Family history of colon cancer    Reida Hem K. Owens Shark PT  Norwood Levo 04/14/2021, 8:09 AM  [place], [place], [address] Phone: [phone]   Fax:  [phone]  Name: Pamela [name] MRN: [id number] Date of Birth: 20-Jan-1976

## 2021-04-17 ENCOUNTER — Ambulatory Visit: Payer: Self-pay

## 2021-05-24 ENCOUNTER — Telehealth: Payer: Self-pay | Admitting: *Deleted

## 2021-05-24 NOTE — Telephone Encounter (Signed)
Received call from pt with complaint of generalized joint pain in bilateral hips, knees, and shoulders x several weeks.  Pt states joint pain is interfering with ADL's.  Per MD pt to stop Letrozole x2 weeks and f/u in office to assess symptoms.  Appt scheduled, pt educated and verbalized understanding.

## 2021-05-24 NOTE — Telephone Encounter (Signed)
Received call from pt.  RN attempt x1 to return call.  No answer, LVM to return call to the office.

## 2021-06-05 NOTE — Progress Notes (Signed)
Patient Care Team: Jim Like, MD as PCP - General (Family Medicine) Irene Limbo, MD as Consulting Physician (Plastic Surgery) Nicholas Lose, MD as Consulting Physician (Hematology and Oncology) Rolm Bookbinder, MD as Consulting Physician (General Surgery)  DIAGNOSIS:    ICD-10-CM   1. Malignant neoplasm of lower-inner quadrant of right breast of female, estrogen receptor positive ([place])  C50.311    Z17.0       SUMMARY OF ONCOLOGIC HISTORY: Oncology History  Malignant neoplasm of lower-inner quadrant of right breast of female, estrogen receptor positive ([place])  09/07/2020 Initial Diagnosis   Palpable right axillary mass.  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    10/12/2020 Cancer Staging   Staging form: Breast, AJCC 8th Edition - Pathologic stage from 10/12/2020: Stage IA (pT1b, pN0, cM0, G1, ER+, PR+, HER2-) - Signed by Gardenia Phlegm, NP on 02/13/2021 Stage prefix: Initial diagnosis Histologic grading system: 3 grade system   09/2020 -  Anti-estrogen oral therapy   Tamoxifen daily     CHIEF COMPLIANT: Follow-up of breast cancer and BRCA1 mutation  INTERVAL HISTORY: Pamela [name] is a 45 y.o. with above-mentioned history of breast cancer and BRCA1 mutation having undergone bilateral mastectomies with reconstruction. She presents to the clinic today for follow-up.  She is complaining of severe hot flashes as well as the diffuse joint aches and pains.  She stopped letrozole couple of weeks ago and her symptoms are starting to get better.  ALLERGIES:   is allergic to metronidazole, sulfamethoxazole-trimethoprim, baclofen, chlorhexidine, and wound dressing adhesive.  MEDICATIONS:  Current Outpatient Medications  Medication Sig Dispense Refill   albuterol (VENTOLIN HFA) 108 (90 Base) MCG/ACT inhaler Inhale 2 puffs into the lungs.     esomeprazole (NEXIUM) 40 MG capsule Take 40 mg by mouth.     hydrOXYzine (ATARAX/VISTARIL) 25 MG tablet Take by mouth.     ibuprofen (ADVIL) 800 MG tablet Take 1 tablet (800 mg total) by mouth every 8 (eight) hours as needed for moderate pain. For AFTER surgery only 30 tablet 0   letrozole (FEMARA) 2.5 MG tablet Take 1 tablet (2.5 mg total) by mouth daily. 90 tablet 3   mirtazapine (REMERON) 7.5 MG tablet Take 7.5 mg by mouth at bedtime.     omeprazole (PRILOSEC) 20 MG capsule Take 20 mg by mouth as needed.     ondansetron (ZOFRAN) 8 MG tablet Take 1 tablet (8 mg total) by mouth every 8 (eight) hours as needed for nausea or vomiting. For AFTER surgery 10 tablet 0   senna-docusate (SENOKOT-S) 8.6-50 MG tablet Take 2 tablets by mouth at bedtime. Do not take if having diarrhea 30 tablet 0   venlafaxine XR (EFFEXOR-XR) 75 MG 24 hr capsule Take 1 capsule (75 mg total) by mouth at bedtime. 30 capsule 6   No current facility-administered medications for this visit.    PHYSICAL EXAMINATION: ECOG PERFORMANCE STATUS: 1 - Symptomatic but completely ambulatory  Vitals:   06/06/21 1535  BP: 121/79  Pulse: 86  Resp: 18  Temp: (!) 97.3 F (36.3 C)  SpO2: 100%  Filed Weights   06/06/21 1535  Weight: 153 lb 1.6 oz (69.4 kg)      LABORATORY DATA:  I have reviewed the data as listed CMP Latest Ref Rng & Units 02/20/2021  Glucose 70 - 99 mg/dL 88  BUN 6 - 20 mg/dL 12  Creatinine 0.44 - 1.00 mg/dL 0.75  Sodium 135 - 145 mmol/L 137  Potassium 3.5 - 5.1 mmol/L 3.9  Chloride 98 - 111 mmol/L 106  CO2 22 - 32 mmol/L 24  Calcium 8.9 - 10.3 mg/dL 9.2    Lab Results  Component Value Date   WBC 4.7 02/20/2021    HGB 13.2 02/20/2021   HCT 40.3 02/20/2021   MCV 88.8 02/20/2021   PLT 269 02/20/2021    ASSESSMENT & PLAN:  Malignant neoplasm of lower-inner quadrant of right breast of female, estrogen receptor positive ([place]) 09/07/2020:Palpable right axillary mass.  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 Bilateral mastectomies and reconstruction: Rt axilla: Grade 1 , 0.7 cm IDC with DCIS, 0/4 LN Neg, Bil mastectomies: Benign , ER 95%, PR 95%, HER2 negative, Ki 67 15%  2. No need of Oncotype DX testing  3. Adjuvant antiestrogen therapy initially started on tamoxifen, switched to letrozole in 04/11/2021 after she had TAH/BSO, switched to anastrozole 06/27/2021   TAH/BSO: 01/2021: No malignancy   Letrozole toxicities:  Diffuse muscle aches and pains Hot flashes moderate to severe: On Effexor I discussed with her that the letrozole is part of the reason for the muscle aches and pains.  She stopped it 2 weeks ago and it appears that the symptoms are slightly better.  Instructed her to stop it for a few more weeks and start anastrozole on 06/27/2021.   Return to clinic with a telephone visit in December to assess tolerance to anastrozole therapy    No orders of the defined types were placed in this encounter.  The patient has a good understanding of the overall plan. she agrees with it. she will call with any problems that may develop before the next visit here.  Total time spent: 20 mins including face to face time and time spent for planning, charting and coordination of care  Rulon Eisenmenger, MD, MPH 06/06/2021  I, Thana Ates, am acting as scribe for Dr. Nicholas Lose.  I have reviewed the above documentation for accuracy and completeness, and I agree with the above.

## 2021-06-06 ENCOUNTER — Other Ambulatory Visit: Payer: Self-pay

## 2021-06-06 ENCOUNTER — Inpatient Hospital Stay: Payer: BC Managed Care – PPO | Attending: Gynecologic Oncology | Admitting: Hematology and Oncology

## 2021-06-06 DIAGNOSIS — C50311 Malignant neoplasm of lower-inner quadrant of right female breast: Secondary | ICD-10-CM

## 2021-06-06 DIAGNOSIS — Z1501 Genetic susceptibility to malignant neoplasm of breast: Secondary | ICD-10-CM | POA: Insufficient documentation

## 2021-06-06 DIAGNOSIS — Z170 Estrogen receptor positive status [ER+]: Secondary | ICD-10-CM

## 2021-06-06 DIAGNOSIS — Z79810 Long term (current) use of selective estrogen receptor modulators (SERMs): Secondary | ICD-10-CM | POA: Insufficient documentation

## 2021-06-06 MED ORDER — ANASTROZOLE 1 MG PO TABS: 1.0000 mg | ORAL_TABLET | Freq: Every day | ORAL | 3 refills | Status: DC

## 2021-06-06 NOTE — Assessment & Plan Note (Signed)
09/07/2020:Palpable right axillary mass. Mammogram([facility]): 0.7 cm mass mid axilla right breast: Biopsy IDC with DCIS, grade 2, ER 85%, PR 96%, HER2 negative 1.4 cm mass in the left breast 4 o'clock position: Cyst BRCA 1 Mutation  Treatment Plan: 1.Bilateral mastectomies and reconstruction: Rt axilla: Grade 1 , 0.7 cm IDC with DCIS, 0/4 LN Neg, Bil mastectomies: Benign ,ER 95%, PR 95%, HER2 negative, Ki 67 15%  2.No need ofOncotype DX testing  3. Adjuvant antiestrogen therapy initially started on tamoxifen, switched to letrozole in 04/11/2021 after she had TAH/BSO  TAH/BSO: 01/2021: No malignancy   Recommend switching her to AI therapy  Letrozole toxicities: Severe Hot flashes since hysterectomy: Started on Effexor 37.5 mg 03/03/21, it is still not helping her significantly therefore we increased the dosage to 75 mg daily.  Return to clinic in 1 year for follow-up

## 2021-07-24 ENCOUNTER — Other Ambulatory Visit: Payer: Self-pay

## 2021-07-24 ENCOUNTER — Ambulatory Visit: Payer: BC Managed Care – PPO | Attending: General Surgery

## 2021-07-24 ENCOUNTER — Ambulatory Visit: Payer: BC Managed Care – PPO

## 2021-07-24 VITALS — Wt 155.5 lb

## 2021-07-24 DIAGNOSIS — Z483 Aftercare following surgery for neoplasm: Secondary | ICD-10-CM | POA: Insufficient documentation

## 2021-07-24 NOTE — Therapy (Signed)
[facility] @ [place], [place], [address] Phone: [phone]   Fax:  [phone]  Physical Therapy Treatment  Patient Details  Name: Pamela [name] MRN: [id number] Date of Birth: [birth year] Referring Provider (PT): Dr. Donne Hazel   Encounter Date: 07/24/2021   PT End of Session - 07/24/21 1648     Visit Number 17   # unchanged due to screen only   PT Start Time 1645    PT Stop Time 1651    PT Time Calculation (min) 6 min    Activity Tolerance Patient tolerated treatment well    Behavior During Therapy [facility] for tasks assessed/performed             Past Medical History:  Diagnosis Date   BRCA1 gene mutation positive in female    Cancer [facility]) 2021   right   Family history of BRCA1 gene positive    Family history of breast cancer    Family history of colon cancer    Family history of prostate cancer    GERD (gastroesophageal reflux disease)    Pneumonia    2015 or 2016   PONV (postoperative nausea and vomiting)    Wears glasses    for reading    Past Surgical History:  Procedure Laterality Date   BREAST RECONSTRUCTION WITH PLACEMENT OF TISSUE EXPANDER AND ALLODERM Bilateral 10/12/2020   Procedure: BILATERAL BREAST RECONSTRUCTION WITH PLACEMENT OF TISSUE EXPANDER AND ALLODERM;  Surgeon: Irene Limbo, MD;  Location: [place];  Service: Plastics;  Laterality: Bilateral;   CERVICAL DISC SURGERY  2017   ruptured disc neck with prosthetic replacement   GANGLION CYST EXCISION Left 2019   left wrist yrs   LIPOSUCTION WITH LIPOFILLING Bilateral 01/06/2021   Procedure: LIPOFILLING FROM ABDOMEN TO BILATERAL CHEST;  Surgeon: Irene Limbo, MD;  Location: [place];  Service: Plastics;  Laterality: Bilateral;   NIPPLE SPARING MASTECTOMY WITH SENTINEL LYMPH NODE BIOPSY Bilateral 10/12/2020   Procedure: BILATERAL NIPPLE SPARING MASTECTOMY WITH RIGHT AXILLARY SENTINEL LYMPH  NODE BIOPSY;  Surgeon: Rolm Bookbinder, MD;  Location: [place];  Service: General;  Laterality: Bilateral;  PEC BLOCK, RNFA   REMOVAL OF BILATERAL TISSUE EXPANDERS WITH PLACEMENT OF BILATERAL BREAST IMPLANTS Bilateral 01/06/2021   Procedure: REMOVAL OF BILATERAL TISSUE EXPANDERS WITH PLACEMENT OF BILATERAL BREAST SILICONE IMPLANTS;  Surgeon: Irene Limbo, MD;  Location: [place];  Service: Plastics;  Laterality: Bilateral;   ROBOTIC ASSISTED TOTAL HYSTERECTOMY WITH BILATERAL SALPINGO OOPHERECTOMY Bilateral 02/22/2021   Procedure: XI ROBOTIC ASSISTED TOTAL HYSTERECTOMY WITH BILATERAL SALPINGO OOPHORECTOMY;  Surgeon: Everitt Amber, MD;  Location: [place];  Service: Gynecology;  Laterality: Bilateral;   TUBAL LIGATION     yrs ago    Vitals:   07/24/21 1646  Weight: 155 lb 8 oz (70.5 kg)     Subjective Assessment - 07/24/21 1648     Subjective Pt returns for her 3 month L-Dex screen.    Pertinent History 10/12/20 bilateral nipple sparing mastectomy with R SLNB (4 nodes all negative) for treatment of R breast cancer that is ER/PR+,HER2-, BRCA 1 positive, plan is to have total hysterectomy on 02/22/21                    L-DEX FLOWSHEETS - 07/24/21 1600       L-DEX LYMPHEDEMA SCREENING   Measurement Type Unilateral    L-DEX MEASUREMENT EXTREMITY Upper Extremity  POSITION  Standing    DOMINANT SIDE Left    At Risk Side Left    BASELINE SCORE (UNILATERAL) 9.6    L-DEX SCORE (UNILATERAL) 11.8    VALUE CHANGE (UNILAT) 2.2                                     PT Long Term Goals - 02/20/21 1205       PT LONG TERM GOAL #1   Title Pt will return to baseline ROM measurements and not demonstrate any signs/symptoms of lymphedema.    Status Partially Met      PT LONG TERM GOAL #2   Title Pt will demonstrate 170 degrees of bilateral shoulder flexion to allow pt to reach overhead.    Baseline R 101,  L 75; 12/05/20- R 150, L 152; 01/30/21- R 155, L 165; Rt 157 (pull felt in axilla at end ROM) and Lt 150 degrees - 02/20/21    Status Not Met      PT LONG TERM GOAL #3   Title Pt will demonstrate 170 degrees of bilateral shoulder abduction to allow her to reach out to the side    Baseline R 75, L 81; 12/05/20- R 122, L 111; 01/30/21- R 170, L 175; Rt 155 (pull felt in axilla) and Lt 164 degrees - 02/20/21    Status Not Met      PT LONG TERM GOAL #4   Title Pt will be independent in a home exercise program for continued strengthening and stretching.    Status Achieved                   Plan - 07/24/21 1654     Clinical Impression Statement Pt returns for her 3 month L-Dex screen. Her change from baseline of 2.2 is WNLs so no further treatment is required at this time except to cont every 3 month L-Dex screens which pt is agreeable to.    PT Next Visit Plan Continue 3 month L-Dex screens for up to 2 years from her SLNB (~10/12/22)    Consulted and Agree with Plan of Care Patient             Patient will benefit from skilled therapeutic intervention in order to improve the following deficits and impairments:     Visit Diagnosis: Aftercare following surgery for neoplasm     Problem List Patient Active Problem List   Diagnosis Date Noted   Breast cancer, right ([place]) 10/12/2020   History of cervical dysplasia 10/06/2020   Malignant neoplasm of lower-inner quadrant of right breast of female, estrogen receptor positive ([place]) 09/16/2020   BRCA1 gene mutation positive in female    Family history of BRCA1 gene positive    Family history of breast cancer    Family history of prostate cancer    Family history of colon cancer     Otelia Limes, PTA 07/24/2021, 4:56 PM  [facility] @ [place], [place], [address] Phone: [phone]   Fax:  [phone]  Name: Pamela [name] MRN: [id number] Date of  Birth: 31-Aug-1975

## 2021-07-27 NOTE — Assessment & Plan Note (Deleted)
09/07/2020:Palpable right axillary mass. Mammogram([facility]): 0.7 cm mass mid axilla right breast: Biopsy IDC with DCIS, grade 2, ER 85%, PR 96%, HER2 negative 1.4 cm mass in the left breast 4 o'clock position: Cyst BRCA 1 Mutation  Treatment Plan: 1.Bilateral mastectomies and reconstruction: Rt axilla: Grade 1 , 0.7 cm IDC with DCIS, 0/4 LN Neg, Bil mastectomies: Benign ,ER95%, PR 95%, HER2 negative, Ki 67 15% 2.No need ofOncotype DX testing  3. Adjuvant antiestrogen therapyinitially started on tamoxifen, switched to letrozole in 04/11/2021 after she had TAH/BSO, switched to anastrozole 06/27/2021  TAH/BSO: 01/2021: No malignancy  Anastrozole toxicities:  1. Diffuse muscle aches and pains 2. Hot flashes moderate to severe: On Effexor   Return to clinic with a telephone visit in December to assess tolerance to anastrozole therapy

## 2021-07-27 NOTE — Progress Notes (Deleted)
  HEMATOLOGY-ONCOLOGY TELEPHONE VISIT PROGRESS NOTE  I connected with Pamela [name] on 07/28/2021 at  9:00 AM EST by telephone and verified that I am speaking with the correct person using two identifiers.  I discussed the limitations, risks, security and privacy concerns of performing an evaluation and management service by telephone and the availability of in person appointments.  I also discussed with the patient that there may be a patient responsible charge related to this service. The patient expressed understanding and agreed to proceed.   History of Present Illness: Pamela [name] is a 45 y.o. female with above-mentioned history of breast cancer and BRCA1 mutation having undergone bilateral mastectomies with reconstruction. She presents via telephone today for follow-up  Oncology History  Malignant neoplasm of lower-inner quadrant of right breast of female, estrogen receptor positive ([place])  09/07/2020 Initial Diagnosis   Palpable right axillary mass.  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    10/12/2020 Cancer Staging   Staging form: Breast, AJCC 8th Edition - Pathologic stage from 10/12/2020: Stage IA (pT1b, pN0, cM0, G1, ER+, PR+, HER2-) - Signed by Gardenia Phlegm, NP on 02/13/2021 Stage prefix: Initial diagnosis Histologic grading system: 3 grade system    09/2020 -  Anti-estrogen oral therapy   Tamoxifen daily     Observations/Objective:     Assessment Plan:  Malignant neoplasm of lower-inner quadrant of right breast of female, estrogen receptor positive  ([place]) 09/07/2020:Palpable right axillary mass.  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 Bilateral mastectomies and reconstruction: Rt axilla: Grade 1 , 0.7 cm IDC with DCIS, 0/4 LN Neg, Bil mastectomies: Benign , ER 95%, PR 95%, HER2 negative, Ki 67 15%  2. No need of Oncotype DX testing  3. Adjuvant antiestrogen therapy initially started on tamoxifen, switched to letrozole in 04/11/2021 after she had TAH/BSO, switched to anastrozole 06/27/2021   TAH/BSO: 01/2021: No malignancy   Anastrozole toxicities:  Diffuse muscle aches and pains Hot flashes moderate to severe: On Effexor    Return to clinic with a telephone visit in December to assess tolerance to anastrozole therapy    I discussed the assessment and treatment plan with the patient. The patient was provided an opportunity to ask questions and all were answered. The patient agreed with the plan and demonstrated an understanding of the instructions. The patient was advised to call back or seek an in-person evaluation if the symptoms worsen or if the condition fails to improve as anticipated.   Total time spent: 12 mins including non-face to face time and time spent for planning, charting and coordination of care  Rulon Eisenmenger, MD 07/28/2021    I, Thana Ates, am acting as scribe for Nicholas Lose, MD.  I have reviewed the above documentation for accuracy and completeness, and I agree with the above.

## 2021-07-28 ENCOUNTER — Inpatient Hospital Stay: Payer: BC Managed Care – PPO | Attending: Gynecologic Oncology | Admitting: Hematology and Oncology

## 2021-07-28 DIAGNOSIS — C50311 Malignant neoplasm of lower-inner quadrant of right female breast: Secondary | ICD-10-CM

## 2021-08-09 NOTE — Progress Notes (Incomplete)
Patient Care Team: Jim Like, MD as PCP - General (Family Medicine) Irene Limbo, MD as Consulting Physician (Plastic Surgery) Nicholas Lose, MD as Consulting Physician (Hematology and Oncology) Rolm Bookbinder, MD as Consulting Physician (General Surgery)  DIAGNOSIS: No diagnosis found.  SUMMARY OF ONCOLOGIC HISTORY: Oncology History  Malignant neoplasm of lower-inner quadrant of right breast of female, estrogen receptor positive ([place])  09/07/2020 Initial Diagnosis   Palpable right axillary mass.  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    10/12/2020 Cancer Staging   Staging form: Breast, AJCC 8th Edition - Pathologic stage from 10/12/2020: Stage IA (pT1b, pN0, cM0, G1, ER+, PR+, HER2-) - Signed by Gardenia Phlegm, NP on 02/13/2021 Stage prefix: Initial diagnosis Histologic grading system: 3 grade system    09/2020 -  Anti-estrogen oral therapy   Tamoxifen daily     CHIEF COMPLIANT: Follow-up of breast cancer and BRCA1 mutation  INTERVAL HISTORY: Pamela [name] is a 45 y.o. with above-mentioned history of breast cancer and BRCA1 mutation having undergone bilateral mastectomies with reconstruction. She presents to the clinic today for follow-up.   ALLERGIES:  is allergic to metronidazole, sulfamethoxazole-trimethoprim, baclofen, chlorhexidine, and wound dressing adhesive.  MEDICATIONS:  Current Outpatient Medications  Medication Sig Dispense Refill   albuterol (VENTOLIN HFA) 108 (90 Base) MCG/ACT inhaler Inhale 2 puffs into the lungs.     anastrozole  (ARIMIDEX) 1 MG tablet Take 1 tablet (1 mg total) by mouth daily. 90 tablet 3   esomeprazole (NEXIUM) 40 MG capsule Take 40 mg by mouth.     hydrOXYzine (ATARAX/VISTARIL) 25 MG tablet Take by mouth.     ibuprofen (ADVIL) 800 MG tablet Take 1 tablet (800 mg total) by mouth every 8 (eight) hours as needed for moderate pain. For AFTER surgery only 30 tablet 0   mirtazapine (REMERON) 7.5 MG tablet Take 7.5 mg by mouth at bedtime.     omeprazole (PRILOSEC) 20 MG capsule Take 20 mg by mouth as needed.     ondansetron (ZOFRAN) 8 MG tablet Take 1 tablet (8 mg total) by mouth every 8 (eight) hours as needed for nausea or vomiting. For AFTER surgery 10 tablet 0   senna-docusate (SENOKOT-S) 8.6-50 MG tablet Take 2 tablets by mouth at bedtime. Do not take if having diarrhea 30 tablet 0   venlafaxine XR (EFFEXOR-XR) 75 MG 24 hr capsule Take 1 capsule (75 mg total) by mouth at bedtime. 30 capsule 6   No current facility-administered medications for this visit.    PHYSICAL EXAMINATION: ECOG PERFORMANCE STATUS: {CHL ONC ECOG [phone]}  There were no vitals filed for this visit. There were no vitals filed for this visit.  BREAST:*** No palpable masses or nodules in either right or left breasts. No palpable axillary supraclavicular or infraclavicular adenopathy no breast tenderness or nipple discharge. (exam performed in the presence of a chaperone)  LABORATORY DATA:  I have reviewed the data as listed CMP Latest Ref Rng & Units 02/20/2021  Glucose 70 - 99 mg/dL 88  BUN 6 - 20 mg/dL 12  Creatinine 0.44 - 1.00 mg/dL  0.75  Sodium 135 - 145 mmol/L 137  Potassium 3.5 - 5.1 mmol/L 3.9  Chloride 98 - 111 mmol/L 106  CO2 22 - 32 mmol/L 24  Calcium 8.9 - 10.3 mg/dL 9.2    Lab Results  Component Value Date   WBC 4.7 02/20/2021   HGB 13.2 02/20/2021   HCT 40.3 02/20/2021   MCV 88.8 02/20/2021   PLT 269 02/20/2021    ASSESSMENT & PLAN:  No problem-specific Assessment & Plan notes found for this  encounter.    No orders of the defined types were placed in this encounter.  The patient has a good understanding of the overall plan. she agrees with it. she will call with any problems that may develop before the next visit here.  Total time spent: *** mins including face to face time and time spent for planning, charting and coordination of care  Rulon Eisenmenger, MD, MPH 08/09/2021  I, Thana Ates, am acting as scribe for Dr. Nicholas Lose.  {insert scribe attestation}

## 2021-08-10 ENCOUNTER — Inpatient Hospital Stay: Payer: BC Managed Care – PPO | Admitting: Hematology and Oncology

## 2021-08-10 NOTE — Assessment & Plan Note (Deleted)
09/07/2020:Palpable right axillary mass. Mammogram([facility]): 0.7 cm mass mid axilla right breast: Biopsy IDC with DCIS, grade 2, ER 85%, PR 96%, HER2 negative 1.4 cm mass in the left breast 4 o'clock position: Cyst BRCA 1 Mutation  Treatment Plan: 1.Bilateral mastectomies and reconstruction: Rt axilla: Grade 1 , 0.7 cm IDC with DCIS, 0/4 LN Neg, Bil mastectomies: Benign ,ER95%, PR 95%, HER2 negative, Ki 67 15% 2.No need ofOncotype DX testing  3. Adjuvant antiestrogen therapyinitially started on tamoxifen, switched to letrozole in 04/11/2021 after she had TAH/BSO, switched to anastrozole 06/27/2021  TAH/BSO: 01/2021: No malignancy  Anastrozole toxicities:   Breast cancer surveillance: No role of imaging studies because she had bilateral mastectomies   Return to clinic in 1 year for follow-up

## 2021-08-23 ENCOUNTER — Telehealth: Payer: Self-pay | Admitting: Hematology and Oncology

## 2021-08-23 NOTE — Telephone Encounter (Signed)
Rescheduled appointment per patient's request.

## 2021-08-24 ENCOUNTER — Encounter: Payer: Self-pay | Admitting: Adult Health

## 2021-08-24 ENCOUNTER — Telehealth: Payer: Self-pay

## 2021-08-24 NOTE — Telephone Encounter (Signed)
Returning call to Ms. Hinze. Patient reports she is having issues with her lower incisions on her left and ride side. " It feels like something is pushing out of it, maybe a stitch?" She states she realizes it has been awhile since her surgery (02/22/21) but was unsure of who to call.  Her incisions have healed completely but she reports the bilateral lower incisions feel hard and raised. Denies fever, chills, redness, warmth or swelling. She states the area is very irritated and itchy. Her upper left abdominal incision has healed completely without issue. She has applied lotion/cocoa butter to her abdomen but itching persists. Patient reports she had steri strips on her lower incisions after surgery and they fell off shortly after. Those incisions healed more open but have completely closed.  Advised patient to send pictures of incisions via mychart and someone from our office will contact her once images have been reviewed. Patient verbalized understanding.

## 2021-08-24 NOTE — Telephone Encounter (Signed)
Following up with Ms. Pamela [name]. Joylene John, NP reviewed images. Overall incisions look good. Advised patient to apply hydrocortisone cream to incisions and massage a few times throughout the day. If she feels like something comes out of the incision notify our office. Patient verbalized understanding and will call with any questions or concerns.

## 2021-09-03 NOTE — Assessment & Plan Note (Signed)
09/07/2020:Palpable right axillary mass. Mammogram([facility]): 0.7 cm mass mid axilla right breast: Biopsy IDC with DCIS, grade 2, ER 85%, PR 96%, HER2 negative 1.4 cm mass in the left breast 4 o'clock position: Cyst BRCA 1 Mutation  Treatment Plan: 1.Bilateral mastectomies and reconstruction: Rt axilla: Grade 1 , 0.7 cm IDC with DCIS, 0/4 LN Neg, Bil mastectomies: Benign ,ER95%, PR 95%, HER2 negative, Ki 67 15% 2.No need ofOncotype DX testing  3. Adjuvant antiestrogen therapyinitially started on tamoxifen, switched to letrozole in 04/11/2021 after she had TAH/BSO, switched to anastrozole 06/27/2021 (Letrozole caused muscle aches and pains and hot flashes)  TAH/BSO: 01/2021: No malignancy  Anastrozole toxicities:

## 2021-09-04 ENCOUNTER — Ambulatory Visit (HOSPITAL_BASED_OUTPATIENT_CLINIC_OR_DEPARTMENT_OTHER): Payer: BC Managed Care – PPO | Admitting: Hematology and Oncology

## 2021-09-04 DIAGNOSIS — Z170 Estrogen receptor positive status [ER+]: Secondary | ICD-10-CM | POA: Diagnosis not present

## 2021-09-04 DIAGNOSIS — C50311 Malignant neoplasm of lower-inner quadrant of right female breast: Secondary | ICD-10-CM | POA: Diagnosis not present

## 2021-09-04 NOTE — Progress Notes (Signed)
HEMATOLOGY-ONCOLOGY TELEPHONE VISIT PROGRESS NOTE  I connected with Pamela [name] on 09/04/21 at 11:30 AM EST by telephone and verified that I am speaking with the correct person using two identifiers.  I discussed the limitations, risks, security and privacy concerns of performing an evaluation and management service by telephone and the availability of in person appointments.  I also discussed with the patient that there may be a patient responsible charge related to this service. The patient expressed understanding and agreed to proceed.   History of Present Illness: Patient reports to be feeling a whole lot better from the hot flashes and joint aches and stiffness standpoint.  She was switched from letrozole to anastrozole.  She still has the symptoms but they are more manageable.  She is doing yoga exercises and stretching to help with that.  She is also taking magnesium and vitamin D supplementation.  Effexor dose was increased to help manage her hot flashes and she is happy about it.    Oncology History  Malignant neoplasm of lower-inner quadrant of right breast of female, estrogen receptor positive ([place])  09/07/2020 Initial Diagnosis   Palpable right axillary mass.  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    10/12/2020 Cancer Staging   Staging form: Breast, AJCC 8th Edition - Pathologic stage from 10/12/2020: Stage IA (pT1b, pN0, cM0, G1, ER+, PR+, HER2-) - Signed by Gardenia Phlegm, NP on 02/13/2021 Stage prefix: Initial diagnosis Histologic grading system: 3 grade system     09/2020 -  Anti-estrogen oral therapy   Tamoxifen daily     REVIEW OF SYSTEMS:   Constitutional: Denies fevers, chills or abnormal weight loss Muscle aches and pains and hot flashes All other systems were reviewed with the patient and are negative. Observations/Objective:    Assessment Plan:  Malignant neoplasm of lower-inner quadrant of right breast of female, estrogen receptor positive ([place]) 09/07/2020:Palpable right axillary mass.  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 Bilateral mastectomies and reconstruction: Rt axilla: Grade 1 , 0.7 cm IDC with DCIS, 0/4 LN Neg, Bil mastectomies: Benign , ER 95%, PR 95%, HER2 negative, Ki 67 15%  2. No need of Oncotype DX testing  3. Adjuvant antiestrogen therapy initially started on tamoxifen, switched to letrozole in 04/11/2021 after she had TAH/BSO, switched to anastrozole 06/27/2021 (Letrozole caused muscle aches and pains and hot flashes)   TAH/BSO: 01/2021: No malignancy   Anastrozole toxicities:  Muscle aches: Magnesium is helping and Vit D daily Hot flashes: better with inc dose of Effexor  Over all better. RTC in 1 year   I discussed the assessment and treatment plan with the patient. The patient was provided an opportunity to ask questions and all were answered. The patient agreed with the plan and demonstrated an understanding of the instructions. The patient was advised to call back or seek an in-person evaluation if the symptoms worsen or if the  condition fails to improve as anticipated.   I provided 12 minutes of non-face-to-face time during this encounter. Harriette Ohara, MD

## 2021-10-23 ENCOUNTER — Ambulatory Visit: Payer: BC Managed Care – PPO

## 2022-06-12 IMAGING — US US PELVIS COMPLETE WITH TRANSVAGINAL
1 series · 14 of 25 positions shown · non-contrast
Comparison: None

CLINICAL DATA: MQEMV deleterious mutation, abnormal uterine
bleeding; LMP 09/09/2020



[Series 1: us pelvis complete with transvaginal · 102 acquisitions, 14 frames shown]
[im 1/102]
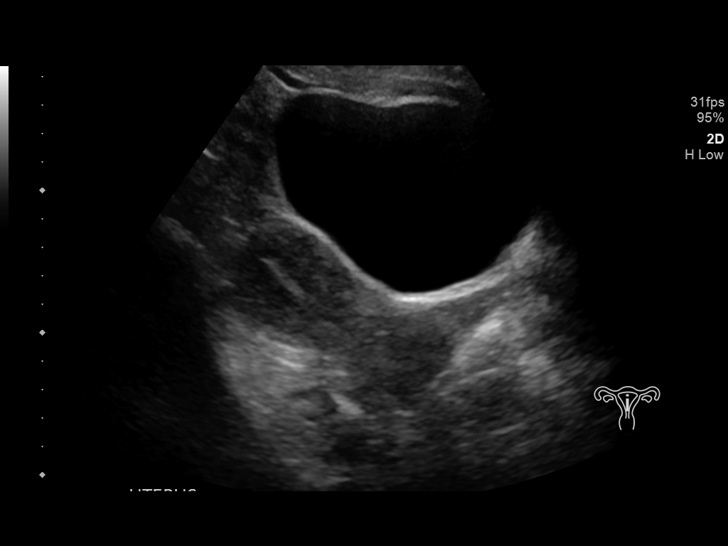
[im 9/102]
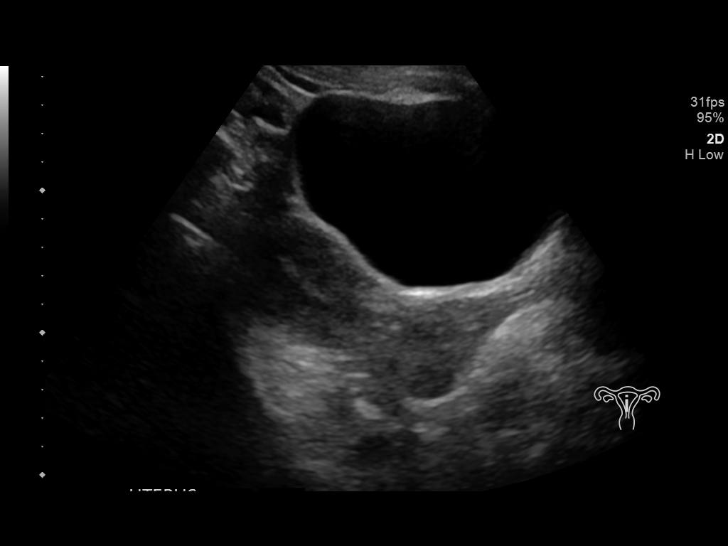
[im 17/102]
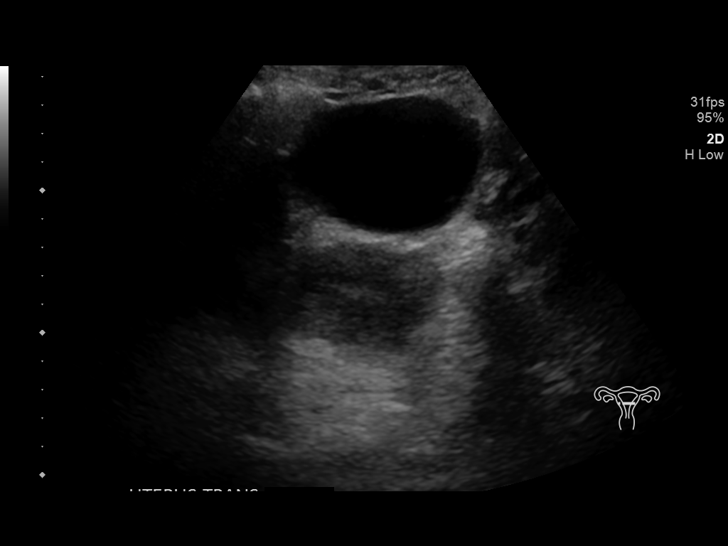
[im 26/102]
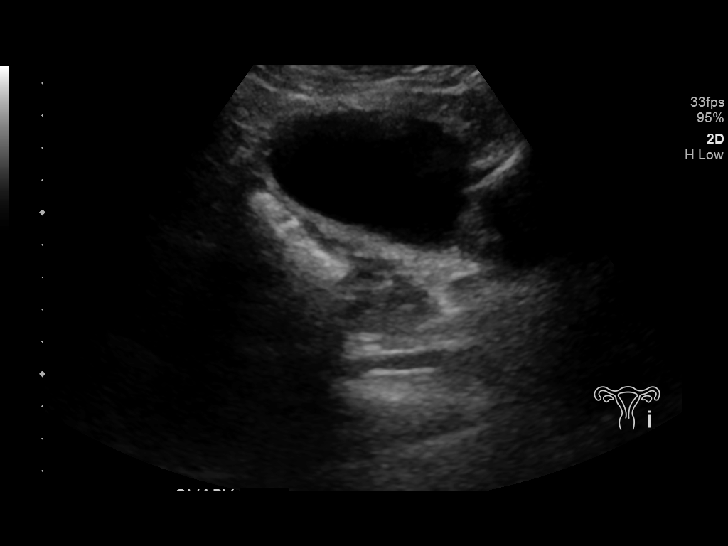
[im 34/102]
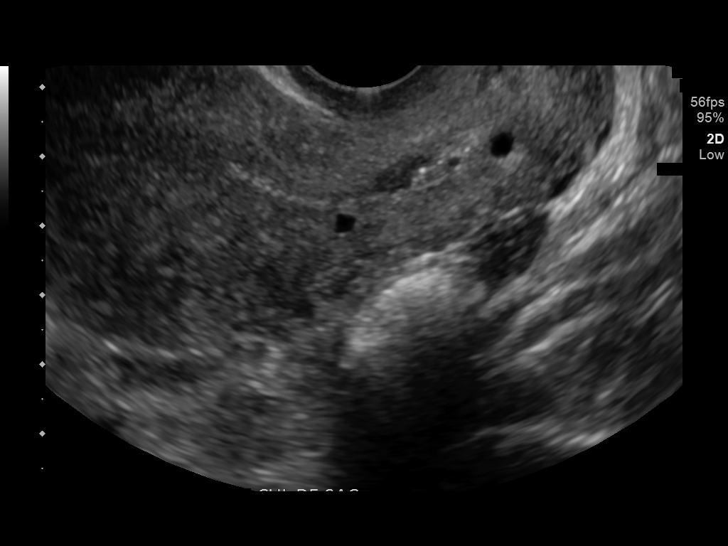
[im 38/102]
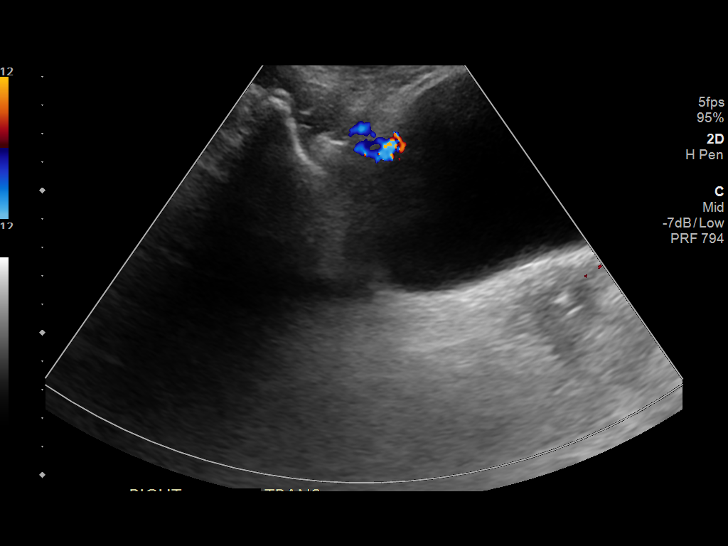
[im 47/102]
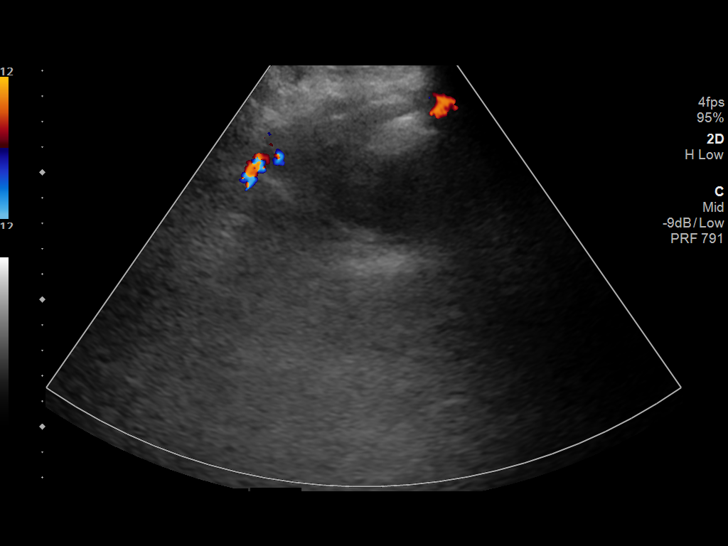
[im 55/102]
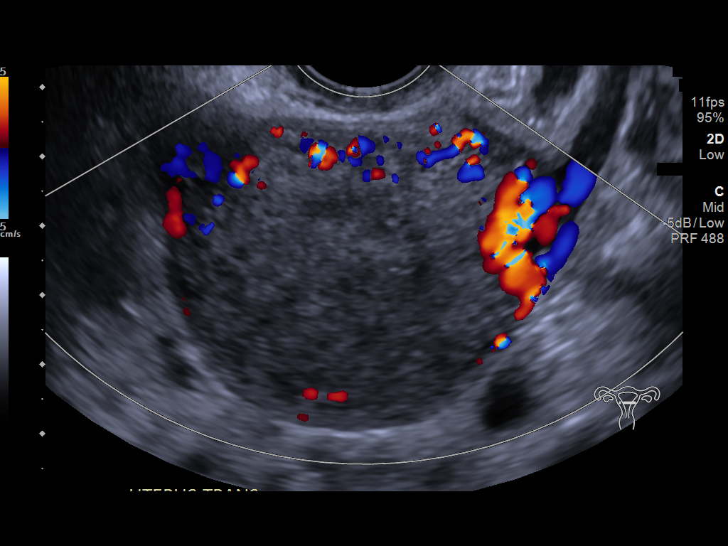
[im 64/102]
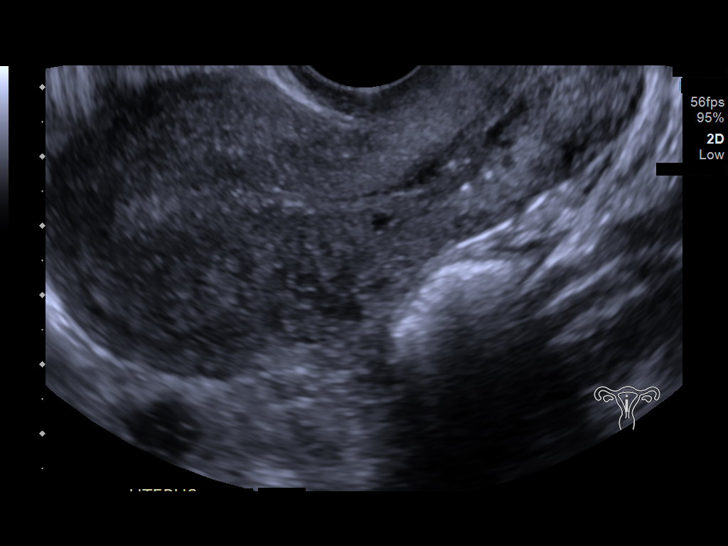
[im 68/102]
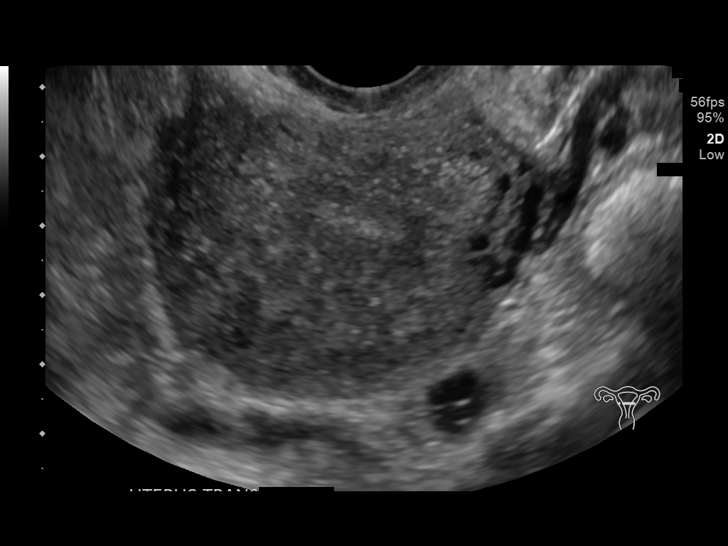
[im 76/102]
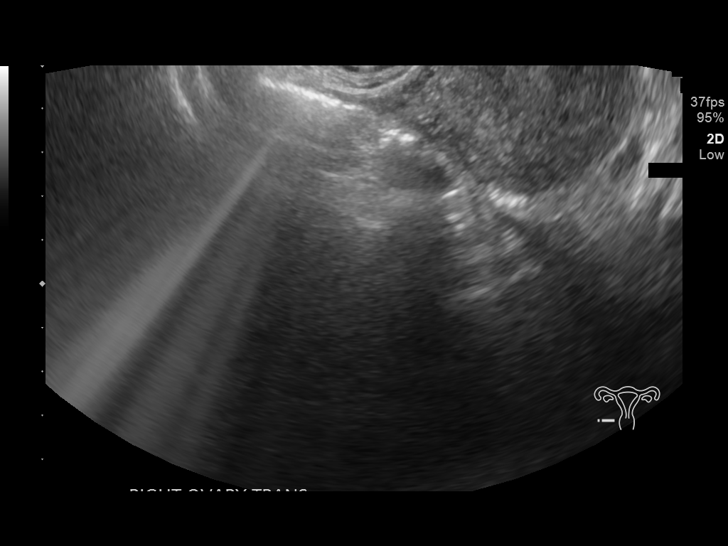
[im 85/102]
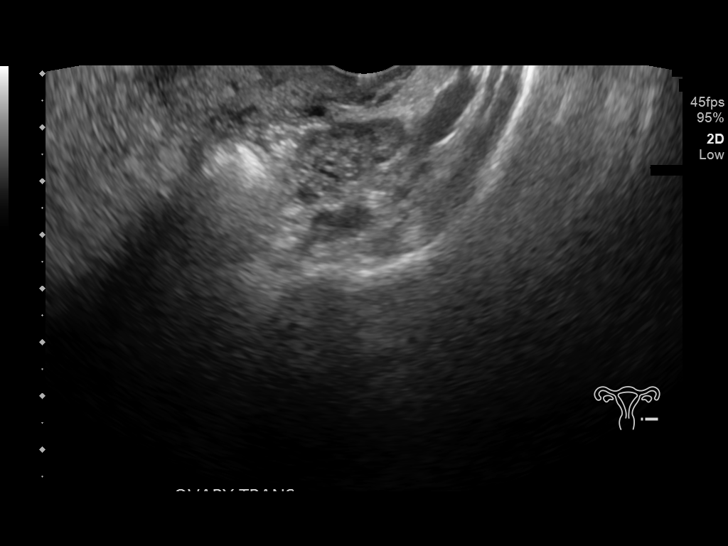
[im 93/102]
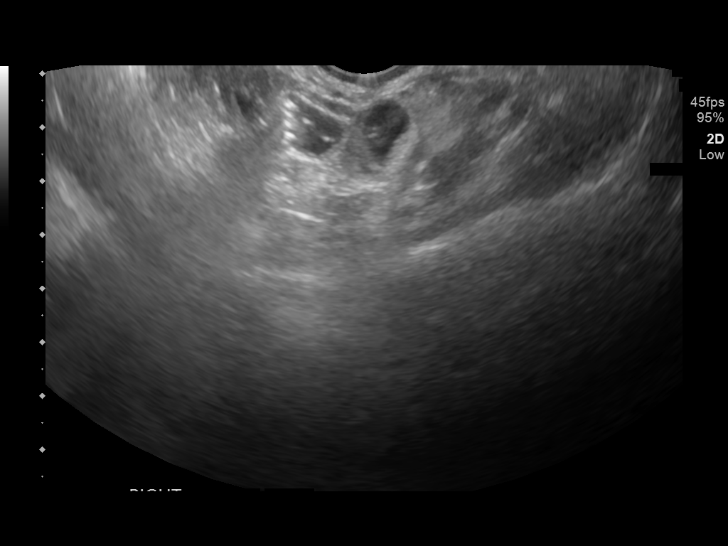
[im 102/102]
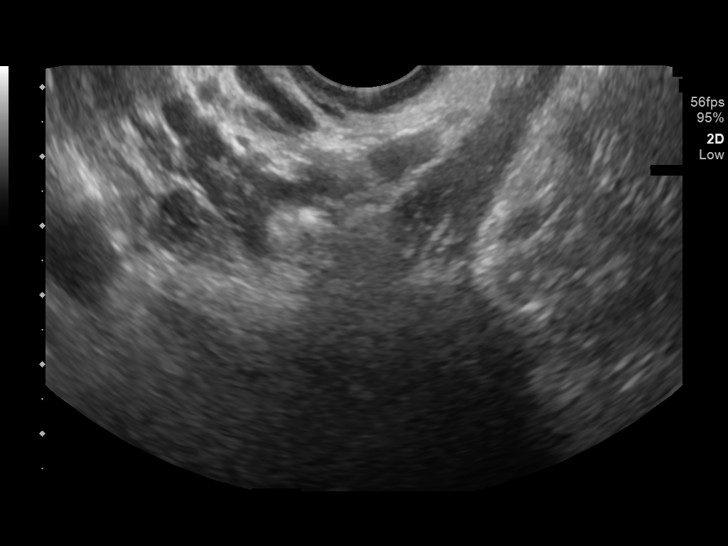

[14 of 25 positions shown; findings below may reference images not displayed]

FINDINGS: Uterus

Measurements: 8.5 x 4.2 x 5.1 cm = volume: 95 mL. Anteverted. Normal
morphology without mass

Endometrium

Thickness: 5 mm.  No endometrial fluid or focal abnormality

Right ovary

Not visualized, likely obscured by bowel

Left ovary

Measurements: 3.6 x 2.2 x 2.7 cm = volume: 8.8 mL. Normal morphology
without mass

Other findings

No free pelvic fluid.  No adnexal masses.
IMPRESSION: Nonvisualization of RIGHT ovary.

Otherwise normal exam.

## 2022-10-24 ENCOUNTER — Telehealth: Payer: Self-pay | Admitting: Hematology and Oncology

## 2022-10-24 NOTE — Telephone Encounter (Signed)
Per 2/28 IB reached out to patient to schedule appointment. Patient aware of date and time of appointment.

## 2022-11-02 NOTE — Progress Notes (Signed)
Patient Care Team: Jim Like, MD as PCP - General (Family Medicine) Irene Limbo, MD as Consulting Physician (Plastic Surgery) Nicholas Lose, MD as Consulting Physician (Hematology and Oncology) Rolm Bookbinder, MD as Consulting Physician (General Surgery)  DIAGNOSIS: No diagnosis found.  SUMMARY OF ONCOLOGIC HISTORY: Oncology History  Malignant neoplasm of lower-inner quadrant of right breast of female, estrogen receptor positive ([place])  09/07/2020 Initial Diagnosis   Palpable right axillary mass.  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    10/12/2020 Cancer Staging   Staging form: Breast, AJCC 8th Edition - Pathologic stage from 10/12/2020: Stage IA (pT1b, pN0, cM0, G1, ER+, PR+, HER2-) - Signed by Gardenia Phlegm, NP on 02/13/2021 Stage prefix: Initial diagnosis Histologic grading system: 3 grade system   09/2020 -  Anti-estrogen oral therapy   Tamoxifen daily     CHIEF COMPLIANT:   INTERVAL HISTORY: Pamela [name] is a   ALLERGIES:  is allergic to metronidazole, sulfamethoxazole-trimethoprim, baclofen, chlorhexidine, and wound dressing adhesive.  MEDICATIONS:  Current Outpatient Medications  Medication Sig Dispense Refill   albuterol (VENTOLIN HFA) 108 (90 Base) MCG/ACT inhaler Inhale 2 puffs into the lungs.     anastrozole (ARIMIDEX) 1 MG tablet Take 1 tablet (1 mg total) by mouth daily. 90 tablet 3   esomeprazole (NEXIUM) 40 MG capsule Take 40 mg by mouth.     hydrOXYzine (ATARAX/VISTARIL) 25 MG tablet Take by mouth.     ibuprofen (ADVIL) 800  MG tablet Take 1 tablet (800 mg total) by mouth every 8 (eight) hours as needed for moderate pain. For AFTER surgery only 30 tablet 0   mirtazapine (REMERON) 7.5 MG tablet Take 7.5 mg by mouth at bedtime.     omeprazole (PRILOSEC) 20 MG capsule Take 20 mg by mouth as needed.     ondansetron (ZOFRAN) 8 MG tablet Take 1 tablet (8 mg total) by mouth every 8 (eight) hours as needed for nausea or vomiting. For AFTER surgery 10 tablet 0   senna-docusate (SENOKOT-S) 8.6-50 MG tablet Take 2 tablets by mouth at bedtime. Do not take if having diarrhea 30 tablet 0   venlafaxine XR (EFFEXOR-XR) 75 MG 24 hr capsule Take 1 capsule (75 mg total) by mouth at bedtime. 30 capsule 6   No current facility-administered medications for this visit.    PHYSICAL EXAMINATION: ECOG PERFORMANCE STATUS: {CHL ONC ECOG [phone]}  There were no vitals filed for this visit. There were no vitals filed for this visit.  BREAST:*** No palpable masses or nodules in either right or left breasts. No palpable axillary supraclavicular or infraclavicular adenopathy no breast tenderness or nipple discharge. (exam performed in the presence of a chaperone)  LABORATORY DATA:  I have reviewed the data as listed    Latest Ref Rng & Units 02/20/2021    9:00 AM  CMP  Glucose 70 - 99 mg/dL 88   BUN 6 - 20 mg/dL 12   Creatinine 0.44 - 1.00 mg/dL 0.75   Sodium 135 - 145 mmol/L 137   Potassium 3.5 - 5.1 mmol/L 3.9   Chloride 98 -  111 mmol/L 106   CO2 22 - 32 mmol/L 24   Calcium 8.9 - 10.3 mg/dL 9.2     Lab Results  Component Value Date   WBC 4.7 02/20/2021   HGB 13.2 02/20/2021   HCT 40.3 02/20/2021   MCV 88.8 02/20/2021   PLT 269 02/20/2021    ASSESSMENT & PLAN:  No problem-specific Assessment & Plan notes found for this encounter.    No orders of the defined types were placed in this encounter.  The patient has a good understanding of the overall plan. she agrees with it. she will call with any problems that may  develop before the next visit here. Total time spent: 30 mins including face to face time and time spent for planning, charting and co-ordination of care   Suzzette Righter, [place] 11/02/22    I Gardiner Coins am acting as a Education administrator for [employer]  ***

## 2022-11-05 ENCOUNTER — Inpatient Hospital Stay: Payer: BC Managed Care – PPO | Attending: Hematology and Oncology | Admitting: Hematology and Oncology

## 2022-11-05 ENCOUNTER — Other Ambulatory Visit: Payer: Self-pay

## 2022-11-05 ENCOUNTER — Telehealth: Payer: Self-pay | Admitting: Family Medicine

## 2022-11-05 VITALS — BP 133/79 | HR 80 | Temp 97.3°F | Resp 18 | Ht 67.0 in | Wt 159.1 lb

## 2022-11-05 DIAGNOSIS — Z780 Asymptomatic menopausal state: Secondary | ICD-10-CM

## 2022-11-05 DIAGNOSIS — Z170 Estrogen receptor positive status [ER+]: Secondary | ICD-10-CM

## 2022-11-05 DIAGNOSIS — C50311 Malignant neoplasm of lower-inner quadrant of right female breast: Secondary | ICD-10-CM

## 2022-11-05 DIAGNOSIS — Z79811 Long term (current) use of aromatase inhibitors: Secondary | ICD-10-CM | POA: Diagnosis not present

## 2022-11-05 NOTE — Telephone Encounter (Signed)
Per 3/11 LOS reached out to schedule patient.

## 2022-11-05 NOTE — Assessment & Plan Note (Addendum)
09/07/2020:Palpable right axillary mass.  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 Bilateral mastectomies and reconstruction: Rt axilla: Grade 1 , 0.7 cm IDC with DCIS, 0/4 LN Neg, Bil mastectomies: Benign , ER 95%, PR 95%, HER2 negative, Ki 67 15%  2. No need of Oncotype DX testing  3. Adjuvant antiestrogen therapy initially started on tamoxifen, switched to letrozole in 04/11/2021 after she had TAH/BSO, switched to anastrozole 06/27/2021 (Letrozole caused muscle aches and pains and hot flashes), stopped August 2023 for muscle pains (she recognized that it was her shoes) resume to 11/05/2022   TAH/BSO: 01/2021: No malignancy   Anastrozole toxicities:  Muscle aches: Got better with improved shoes Hot flashes: better on Effexor  Will order bone density test next month Breast cancer surveillance: No role of imaging studies since she had bilateral mastectomies Chest wall examination no palpable lumps or nodules of concern  Return to clinic in 1 year for follow-up

## 2023-03-04 ENCOUNTER — Telehealth: Payer: Self-pay

## 2023-03-04 NOTE — Telephone Encounter (Signed)
Pt called and states she is experiencing RUE weakness, soreness, swelling and heaviness. 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 She agreed to an appt with Lillard Anes 03/07/23 at 315.

## 2023-03-07 ENCOUNTER — Inpatient Hospital Stay: Payer: BC Managed Care – PPO | Attending: Adult Health | Admitting: Adult Health

## 2023-03-07 ENCOUNTER — Encounter: Payer: Self-pay | Admitting: Adult Health

## 2023-03-07 ENCOUNTER — Ambulatory Visit (HOSPITAL_BASED_OUTPATIENT_CLINIC_OR_DEPARTMENT_OTHER)
Admission: RE | Admit: 2023-03-07 | Discharge: 2023-03-07 | Disposition: A | Discharge status: Home / Self Care | Payer: BC Managed Care – PPO | Source: Ambulatory Visit | Attending: Adult Health | Admitting: Adult Health

## 2023-03-07 VITALS — BP 111/82 | HR 86 | Temp 97.0°F | Resp 18 | Wt 153.0 lb

## 2023-03-07 DIAGNOSIS — C50311 Malignant neoplasm of lower-inner quadrant of right female breast: Secondary | ICD-10-CM | POA: Insufficient documentation

## 2023-03-07 DIAGNOSIS — Z170 Estrogen receptor positive status [ER+]: Secondary | ICD-10-CM | POA: Insufficient documentation

## 2023-03-07 DIAGNOSIS — R0789 Other chest pain: Secondary | ICD-10-CM | POA: Diagnosis not present

## 2023-03-07 DIAGNOSIS — R5383 Other fatigue: Secondary | ICD-10-CM | POA: Diagnosis not present

## 2023-03-07 DIAGNOSIS — M7989 Other specified soft tissue disorders: Secondary | ICD-10-CM

## 2023-03-07 DIAGNOSIS — Z79811 Long term (current) use of aromatase inhibitors: Secondary | ICD-10-CM | POA: Insufficient documentation

## 2023-03-07 NOTE — Progress Notes (Signed)
Right upper extremity venous study completed.   Preliminary results relayed to Tammy.  Please see CV Procedures for preliminary results.  Brayn Eckstein, RVT  4:17 PM 03/07/23

## 2023-03-07 NOTE — Assessment & Plan Note (Addendum)
Amaura is a 47 year old BRCA + woman with h/o stage IA ER/PR + IDC diagnosed in 08/2020 s/p bilateral mastectomies, TAH/BSO, and anastrozole that began in 09/2020.  Right arm swelling: Likely lymphedema, however since she is more than 2 years out since undergoing her surgery with LN biopsy will obtain doppler to rule out DVT and CT chest to r/o obstructive lymphadenopathy.  Will refer to PT after imaging has been completed.  Fatigue: I let Clois know that it is normal for her to feel more tired and less strong after undergoing her surgeries and taking the anastrozole. She verbalized understanding of this.    RTC 02/2024 for f/u with Dr. Pamelia Hoit, will f/u with patient about above results as indicated.

## 2023-03-07 NOTE — Progress Notes (Signed)
[facility] Cancer Follow up:    Pamela Halsted, MD [address] [place] [address]   DIAGNOSIS:  Cancer Staging  Malignant neoplasm of lower-inner quadrant of right breast of female, estrogen receptor positive (HCC) Staging form: Breast, AJCC 8th Edition - Clinical stage from 09/07/2020: Stage IA (cT1c, cN0, cM0, G2, ER+, PR+, HER2-) - Unsigned Histologic grading system: 3 grade system - Pathologic stage from 10/12/2020: Stage IA (pT1b, pN0, cM0, G1, ER+, PR+, HER2-) - Signed by Loa Socks, NP on 02/13/2021 Stage prefix: Initial diagnosis Histologic grading system: 3 grade system   SUMMARY OF ONCOLOGIC HISTORY: Oncology History  Malignant neoplasm of lower-inner quadrant of right breast of female, estrogen receptor positive (HCC)  09/07/2020 Initial Diagnosis   Palpable right axillary mass.  Mammogram: 0.7 cm mass mid axilla right breast: Biopsy IDC with DCIS, grade 2, ER 85%, PR 96%, HER2 negative 1.4 cm mass in the left breast 4 o'clock position: Cyst    Miscellaneous   BRCA1 mutation   10/12/2020 Surgery   Bilateral mastectomies with reconstruction [facility]):  Left breast: no malignancy , Right breast: invasive and in situ ductal carcinoma, 0.7cm, clear margins, 4 right axillary lymph nodes negative for carcinoma.    10/12/2020 Cancer Staging   Staging form: Breast, AJCC 8th Edition - Pathologic stage from 10/12/2020: Stage IA (pT1b, pN0, cM0, G1, ER+, PR+, HER2-) - Signed by Loa Socks, NP on 02/13/2021 Stage prefix: Initial diagnosis Histologic grading system: 3 grade system   09/2020 -  Anti-estrogen oral therapy   Anastrozole   01/2021 Surgery   TAH/BSO-no malignancy     CURRENT THERAPY: Anastrozole  INTERVAL HISTORY: Pamela [name] 47 y.o. female returns for urgent evaluation of right arm discomfort.  She notes this began a couple of months ago as a soreness, then tenderness with using her right arm.   Now she is feeling fullness and heaviness in arm.  She does have pain in her right chest wall that wraps around to her scapula.  She notes she is fatigued and feels overall weaker than she previously was.     Patient Active Problem List   Diagnosis Date Noted   Breast cancer, right (HCC) 10/12/2020   History of cervical dysplasia 10/06/2020   Malignant neoplasm of lower-inner quadrant of right breast of female, estrogen receptor positive (HCC) 09/16/2020   BRCA1 gene mutation positive in female    Family history of BRCA1 gene positive    Family history of breast cancer    Family history of prostate cancer    Family history of colon cancer     is allergic to metronidazole, sulfamethoxazole-trimethoprim, baclofen, chlorhexidine, and wound dressing adhesive.  MEDICAL HISTORY: Past Medical History:  Diagnosis Date   BRCA1 gene mutation positive in female    Cancer [facility]) 2021   right   Family history of BRCA1 gene positive    Family history of breast cancer    Family history of colon cancer    Family history of prostate cancer    GERD (gastroesophageal reflux disease)    Pneumonia    2015 or 2016   PONV (postoperative nausea and vomiting)    Wears glasses    for reading    SURGICAL HISTORY: Past Surgical History:  Procedure Laterality Date   BREAST RECONSTRUCTION WITH PLACEMENT OF TISSUE EXPANDER AND ALLODERM Bilateral 10/12/2020   Procedure: BILATERAL BREAST RECONSTRUCTION WITH PLACEMENT OF TISSUE EXPANDER AND ALLODERM;  Surgeon: Glenna Fellows,  MD;  Location: [facility];  Service: Plastics;  Laterality: Bilateral;   CERVICAL DISC SURGERY  2017   ruptured disc neck with prosthetic replacement   GANGLION CYST EXCISION Left 2019   left wrist yrs   LIPOSUCTION WITH LIPOFILLING Bilateral 01/06/2021   Procedure: LIPOFILLING FROM ABDOMEN TO BILATERAL CHEST;  Surgeon: Glenna Fellows, MD;  Location: [facility];  Service: Plastics;  Laterality:  Bilateral;   NIPPLE SPARING MASTECTOMY WITH SENTINEL LYMPH NODE BIOPSY Bilateral 10/12/2020   Procedure: BILATERAL NIPPLE SPARING MASTECTOMY WITH RIGHT AXILLARY SENTINEL LYMPH NODE BIOPSY;  Surgeon: Emelia Loron, MD;  Location: [facility];  Service: General;  Laterality: Bilateral;  PEC BLOCK, RNFA   REMOVAL OF BILATERAL TISSUE EXPANDERS WITH PLACEMENT OF BILATERAL BREAST IMPLANTS Bilateral 01/06/2021   Procedure: REMOVAL OF BILATERAL TISSUE EXPANDERS WITH PLACEMENT OF BILATERAL BREAST SILICONE IMPLANTS;  Surgeon: Glenna Fellows, MD;  Location:  SURGERY CENTER;  Service: Plastics;  Laterality: Bilateral;   ROBOTIC ASSISTED TOTAL HYSTERECTOMY WITH BILATERAL SALPINGO OOPHERECTOMY Bilateral 02/22/2021   Procedure: XI ROBOTIC ASSISTED TOTAL HYSTERECTOMY WITH BILATERAL SALPINGO OOPHORECTOMY;  Surgeon: Adolphus Birchwood, MD;  Location: [facility] ;  Service: Gynecology;  Laterality: Bilateral;   TUBAL LIGATION     yrs ago    SOCIAL HISTORY: Social History   Socioeconomic History   Marital status: Married    Spouse name: Mallie Giambra   Number of children: 3   Years of education: Not on file   Highest education level: Not on file  Occupational History   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[employer]  Tobacco Use   Smoking status: Former    Current packs/day: 0.12    Average packs/day: 0.1 packs/day for 5.0 years (0.6 ttl pk-yrs)    Types: Cigarettes   Smokeless tobacco: Never   Tobacco comments:    quit 15 years ago  Vaping Use   Vaping status: Never Used  Substance and Sexual Activity   Alcohol use: Yes    Alcohol/week: 2.0 standard drinks of alcohol    Types: 2 Glasses of wine per week    Comment: occ   Drug use: Never   Sexual activity: Yes    Birth control/protection: Surgical  Other Topics Concern   Not on file  Social History Narrative   ** Merged History Encounter **       Social Determinants of Health   Financial Resource Strain: Low Risk   (06/11/2022)   Received from [facility], [facility]   Overall Financial Resource Strain (CARDIA)    Difficulty of Paying Living Expenses: Not hard at all  Food Insecurity: Not on file  Transportation Needs: Not on file  Physical Activity: Not on file  Stress: Not on file  Social Connections: Not on file  Intimate Partner Violence: Not on file    FAMILY HISTORY: Family History  Problem Relation Age of Onset   Breast cancer Mother        dx. in her 38s   Breast cancer Sister 30       BRCA1 positive: c.[id number]>G (Intronic)   Cancer Maternal Aunt        unconfirmed, patient suspects that she died from cancer younger than 47   Breast cancer Maternal Grandmother        bilateral   Cancer Maternal Grandmother        gynecologic   Prostate cancer Maternal Uncle        dx. in his 64s  Colon cancer Paternal Aunt        died in her early 49s   Breast cancer Cousin 64       maternal first cousin   Breast cancer Cousin        dx. in her 35s, paternal first cousin    Review of Systems  Constitutional:  Positive for fatigue. Negative for appetite change, chills, fever and unexpected weight change.  HENT:   Negative for hearing loss, lump/mass and trouble swallowing.   Eyes:  Negative for eye problems and icterus.  Respiratory:  Negative for chest tightness, cough and shortness of breath.   Cardiovascular:  Negative for chest pain, leg swelling and palpitations.  Gastrointestinal:  Negative for abdominal distention, abdominal pain, constipation, diarrhea, nausea and vomiting.  Endocrine: Negative for hot flashes.  Genitourinary:  Negative for difficulty urinating.   Musculoskeletal:  Negative for arthralgias.  Skin:  Negative for itching and rash.  Neurological:  Negative for dizziness, extremity weakness, headaches and numbness.  Hematological:  Negative for adenopathy. Does not bruise/bleed easily.  Psychiatric/Behavioral:  Negative for  depression. The patient is not nervous/anxious.       PHYSICAL EXAMINATION    Vitals:   03/07/23 1517  BP: 111/82  Pulse: 86  Resp: 18  Temp: (!) 97 F (36.1 C)  SpO2: 100%    Physical Exam Constitutional:      General: She is not in acute distress.    Appearance: Normal appearance. She is not toxic-appearing.  HENT:     Head: Normocephalic and atraumatic.     Mouth/Throat:     Mouth: Mucous membranes are moist.     Pharynx: Oropharynx is clear. No oropharyngeal exudate or posterior oropharyngeal erythema.  Eyes:     General: No scleral icterus. Cardiovascular:     Rate and Rhythm: Normal rate and regular rhythm.     Pulses: Normal pulses.     Heart sounds: Normal heart sounds.  Pulmonary:     Effort: Pulmonary effort is normal.     Breath sounds: Normal breath sounds.  Chest:     Comments: + tenderness to right chest wall radiating to scapula to palpation, no nodules, masses or signs of local recurrence of her breast cancer Abdominal:     General: Abdomen is flat. Bowel sounds are normal. There is no distension.     Palpations: Abdomen is soft.     Tenderness: There is no abdominal tenderness.  Musculoskeletal:        General: Swelling present.     Cervical back: Neck supple.     Comments: + right arm swelling  Lymphadenopathy:     Cervical: No cervical adenopathy.  Skin:    General: Skin is warm and dry.     Findings: No rash.  Neurological:     General: No focal deficit present.     Mental Status: She is alert.  Psychiatric:        Mood and Affect: Mood normal.        Behavior: Behavior normal.     LABORATORY DATA:  CBC    Component Value Date/Time   WBC 4.7 02/20/2021 0900   RBC 4.54 02/20/2021 0900   HGB 13.2 02/20/2021 0900   HCT 40.3 02/20/2021 0900   PLT 269 02/20/2021 0900   MCV 88.8 02/20/2021 0900   MCH 29.1 02/20/2021 0900   MCHC 32.8 02/20/2021 0900   RDW 12.8 02/20/2021 0900    CMP     Component Value Date/Time  NA 137  02/20/2021 0900   K 3.9 02/20/2021 0900   CL 106 02/20/2021 0900   CO2 24 02/20/2021 0900   GLUCOSE 88 02/20/2021 0900   BUN 12 02/20/2021 0900   CREATININE 0.75 02/20/2021 0900   CALCIUM 9.2 02/20/2021 0900   GFRNONAA >60 02/20/2021 0900       ASSESSMENT and THERAPY PLAN:   Malignant neoplasm of lower-inner quadrant of right breast of female, estrogen receptor positive (HCC) Pamela [name] is a 47 year old BRCA + woman with h/o stage IA ER/PR + IDC diagnosed in 08/2020 s/p bilateral mastectomies, TAH/BSO, and anastrozole that began in 09/2020.  Right arm swelling: Likely lymphedema, however since she is more than 2 years out since undergoing her surgery with LN biopsy will obtain doppler to rule out DVT and CT chest to r/o obstructive lymphadenopathy.  Will refer to PT after imaging has been completed.  Fatigue: I let Pamela [name] know that it is normal for her to feel more tired and less strong after undergoing her surgeries and taking the anastrozole. She verbalized understanding of this.    RTC 02/2024 for f/u with Dr. Pamelia Hoit, will f/u with patient about above results as indicated.    All questions were answered. The patient knows to call the clinic with any problems, questions or concerns. We can certainly see the patient much sooner if necessary.  Total encounter time:30 minutes*in face-to-face visit time, chart review, lab review, care coordination, order entry, and documentation of the encounter time.  Lillard Anes, NP 03/07/23 3:57 PM Medical Oncology and Hematology [facility] [address] [place], [place] [address] Tel. [phone]    Fax. [phone]  *Total Encounter Time as defined by the Centers for Medicare and Medicaid Services includes, in addition to the face-to-face time of a patient visit (documented in the note above) non-face-to-face time: obtaining and reviewing outside history, ordering and reviewing medications, tests or procedures, care coordination  (communications with other health care professionals or caregivers) and documentation in the medical record.

## 2023-03-13 ENCOUNTER — Ambulatory Visit (HOSPITAL_COMMUNITY)
Admission: RE | Admit: 2023-03-13 | Discharge: 2023-03-13 | Disposition: A | Discharge status: Home / Self Care | Payer: BC Managed Care – PPO | Source: Ambulatory Visit | Attending: Adult Health | Admitting: Adult Health

## 2023-03-13 DIAGNOSIS — Z170 Estrogen receptor positive status [ER+]: Secondary | ICD-10-CM | POA: Insufficient documentation

## 2023-03-13 DIAGNOSIS — C50311 Malignant neoplasm of lower-inner quadrant of right female breast: Secondary | ICD-10-CM | POA: Insufficient documentation

## 2023-03-13 DIAGNOSIS — R0789 Other chest pain: Secondary | ICD-10-CM | POA: Insufficient documentation

## 2023-03-13 DIAGNOSIS — M7989 Other specified soft tissue disorders: Secondary | ICD-10-CM | POA: Diagnosis present

## 2023-03-13 MED ORDER — IOHEXOL 350 MG/ML SOLN
50.0000 mL | Freq: Once | INTRAVENOUS | Status: AC | PRN
  Administered 2023-03-13: 50 mL via INTRAVENOUS

## 2023-03-20 ENCOUNTER — Telehealth: Payer: Self-pay

## 2023-03-20 DIAGNOSIS — C50311 Malignant neoplasm of lower-inner quadrant of right female breast: Secondary | ICD-10-CM

## 2023-03-20 DIAGNOSIS — Z170 Estrogen receptor positive status [ER+]: Secondary | ICD-10-CM

## 2023-03-20 DIAGNOSIS — M7989 Other specified soft tissue disorders: Secondary | ICD-10-CM

## 2023-03-20 NOTE — Telephone Encounter (Signed)
-----   Message from Noreene Filbert sent at 03/20/2023 11:57 AM EDT ----- Please let patient know her scan looks good, no obstructive cause for her lymphedema, or cancer recurrence.  If she has not been to PT, please refer ----- Message ----- From: Interface, Rad Results In Sent: 03/20/2023  11:20 AM EDT To: Loa Socks, NP

## 2023-03-20 NOTE — Telephone Encounter (Signed)
Attempted to call pt per NP to give results and recommendations. Order placed for PT per NP.

## 2023-03-20 NOTE — Telephone Encounter (Signed)
Pt returned call and is in agreement with plan for PT.

## 2023-04-03 ENCOUNTER — Ambulatory Visit: Payer: BC Managed Care – PPO | Admitting: Rehabilitation

## 2023-04-12 ENCOUNTER — Other Ambulatory Visit: Payer: Self-pay | Admitting: Hematology and Oncology

## 2023-04-15 NOTE — Therapy (Signed)
OUTPATIENT PHYSICAL THERAPY  UPPER EXTREMITY ONCOLOGY EVALUATION  Patient Name: Pamela [name] MRN: [id number] DOB:05-13-1976, 47 y.o., female Today's Date: 04/16/2023  END OF SESSION:  PT End of Session - 04/16/23 1302     Visit Number 1    Number of Visits 12    Date for PT Re-Evaluation 05/28/23    PT Start Time 0906    PT Stop Time 1003    PT Time Calculation (min) 57 min    Activity Tolerance Patient tolerated treatment well    Behavior During Therapy [facility] for tasks assessed/performed             Past Medical History:  Diagnosis Date   BRCA1 gene mutation positive in female    Cancer [facility]) 2021   right   Family history of BRCA1 gene positive    Family history of breast cancer    Family history of colon cancer    Family history of prostate cancer    GERD (gastroesophageal reflux disease)    Pneumonia    2015 or 2016   PONV (postoperative nausea and vomiting)    Wears glasses    for reading   Past Surgical History:  Procedure Laterality Date   BREAST RECONSTRUCTION WITH PLACEMENT OF TISSUE EXPANDER AND ALLODERM Bilateral 10/12/2020   Procedure: BILATERAL BREAST RECONSTRUCTION WITH PLACEMENT OF TISSUE EXPANDER AND ALLODERM;  Surgeon: Glenna Fellows, MD;  Location: [facility];  Service: Plastics;  Laterality: Bilateral;   CERVICAL DISC SURGERY  2017   ruptured disc neck with prosthetic replacement   GANGLION CYST EXCISION Left 2019   left wrist yrs   LIPOSUCTION WITH LIPOFILLING Bilateral 01/06/2021   Procedure: LIPOFILLING FROM ABDOMEN TO BILATERAL CHEST;  Surgeon: Glenna Fellows, MD;  Location: [facility];  Service: Plastics;  Laterality: Bilateral;   NIPPLE SPARING MASTECTOMY WITH SENTINEL LYMPH NODE BIOPSY Bilateral 10/12/2020   Procedure: BILATERAL NIPPLE SPARING MASTECTOMY WITH RIGHT AXILLARY SENTINEL LYMPH NODE BIOPSY;  Surgeon: Emelia Loron, MD;  Location: [facility];  Service: General;  Laterality:  Bilateral;  PEC BLOCK, RNFA   REMOVAL OF BILATERAL TISSUE EXPANDERS WITH PLACEMENT OF BILATERAL BREAST IMPLANTS Bilateral 01/06/2021   Procedure: REMOVAL OF BILATERAL TISSUE EXPANDERS WITH PLACEMENT OF BILATERAL BREAST SILICONE IMPLANTS;  Surgeon: Glenna Fellows, MD;  Location: [facility];  Service: Plastics;  Laterality: Bilateral;   ROBOTIC ASSISTED TOTAL HYSTERECTOMY WITH BILATERAL SALPINGO OOPHERECTOMY Bilateral 02/22/2021   Procedure: XI ROBOTIC ASSISTED TOTAL HYSTERECTOMY WITH BILATERAL SALPINGO OOPHORECTOMY;  Surgeon: Adolphus Birchwood, MD;  Location: [facility];  Service: Gynecology;  Laterality: Bilateral;   TUBAL LIGATION     yrs ago   Patient Active Problem List   Diagnosis Date Noted   Breast cancer, right (HCC) 10/12/2020   History of cervical dysplasia 10/06/2020   Malignant neoplasm of lower-inner quadrant of right breast of female, estrogen receptor positive (HCC) 09/16/2020   BRCA1 gene mutation positive in female    Family history of BRCA1 gene positive    Family history of breast cancer    Family history of prostate cancer    Family history of colon cancer     PCP: Rubye Oaks, MD  REFERRING PROVIDER: Lillard Anes, NP  REFERRING DIAG: Right UE lymphedema  THERAPY DIAG:  Lymphedema, not elsewhere classified  Aftercare following surgery for neoplasm  ONSET DATE: June 2024  Rationale for Evaluation and Treatment: Rehabilitation  SUBJECTIVE:  SUBJECTIVE STATEMENT:  I am having a lot of discomfort/pain in my right arm. It starts at my neck and is worse at my elbow, but its not tender right now. It also wraps around under my arm to the shoulder blade area. It is really swollen. I live 1 hr and 10 minutes from here.  PERTINENT HISTORY:  10/12/20  bilateral nipple sparing mastectomy with R SLNB (4 nodes all negative) for treatment of R breast cancer that is ER/PR+,HER2-, BRCA 1 positive, She did not have chemo or radiation. She had a  total hysterectomy on 02/22/21 . She had a venous doppler on 03/07/2023 that was negative as well as a Chest CT that was negative.  PAIN:  Are you having pain? Yes NPRS scale: 4-9/10 Pain location: right UE Pain orientation: Right  PAIN TYPE: aching and burning Pain description: constant  Aggravating factors: using arm,disturbed sleep Relieving factors: not using arm, Tylenol minimally  PRECAUTIONS: Right UE lymphedema, SVT, anxiety  RED FLAGS: None   WEIGHT BEARING RESTRICTIONS: No  FALLS:  Has patient fallen in last 6 months? No  LIVING ENVIRONMENT: Lives with: lives with their family, husband, children , grandchildren Lives in: House/apartment   OCCUPATION: Nurse;[employer]  LEISURE: flowers  HAND DOMINANCE: left   PRIOR LEVEL OF FUNCTION: Independent  PATIENT GOALS: Relief of pain/swelling   OBJECTIVE:  COGNITION: Overall cognitive status: Within functional limits for tasks assessed   PALPATION: No tenderness presently  OBSERVATIONS / OTHER ASSESSMENTS: no cording noted., right lateral trunk swelling, mild thickening at axillary incision  SENSATION: Light touch: Deficits axilla,  POSTURE: forward head, rounded shoulders  UPPER EXTREMITY AROM/PROM:  A/PROM RIGHT   eval   Shoulder extension   Shoulder flexion 135  Shoulder abduction 154  Shoulder internal rotation   Shoulder external rotation     (Blank rows = not tested)  A/PROM LEFT   eval  Shoulder extension   Shoulder flexion 149  Shoulder abduction 159  Shoulder internal rotation   Shoulder external rotation     (Blank rows = not tested)  CERVICAL AROM: All within functional limits:     UPPER EXTREMITY STRENGTH:   LYMPHEDEMA ASSESSMENTS:   SURGERY TYPE/DATE: 10/12/20 bilateral nipple sparing  mastectomy with R SLNB (4 nodes all negative) with immediate tissue expanders  NUMBER OF LYMPH NODES REMOVED: 0/4  CHEMOTHERAPY: NO  RADIATION:NO  HORMONE TREATMENT: not taking  INFECTIONS: NO   LYMPHEDEMA ASSESSMENTS:   LANDMARK RIGHT  eval  At axilla  31.4  15 cm proximal to olecranon process 30  10 cm proximal to olecranon process 29.5  Olecranon process 27  15 cm proximal to ulnar styloid process 25.2  10 cm proximal to ulnar styloid process 21.5  Just proximal to ulnar styloid process 16.5  Across hand at thumb web space 18.8  At base of 2nd digit 5.8  (Blank rows = not tested)  LANDMARK LEFT  eval  At axilla  29.7  15 cm proximal to olecranon process 29  10 cm proximal to olecranon process 28.4  Olecranon process 26.5  15 cm proximal to ulnar styloid process 23.8  10 cm proximal to ulnar styloid process 20.1  Just proximal to ulnar styloid process 15.9  Across hand at thumb web space 18.4  At base of 2nd digit 5.9  (Blank rows = not tested)   FUNCTIONAL TESTS:    GAIT: Normal  L-DEX LYMPHEDEMA SCREENING: The patient was assessed using the L-Dex machine today to produce a lymphedema index  baseline score. The patient will be reassessed on a regular basis (typically every 3 months) to obtain new L-Dex scores. If the score is > 6.5 points away from his/her baseline score indicating onset of subclinical lymphedema, it will be recommended to wear a compression garment for 4 weeks, 12 hours per day and then be reassessed. If the score continues to be > 6.5 points from baseline at reassessment, we will initiate lymphedema treatment. Assessing in this manner has a 95% rate of preventing clinically significant lymphedema. Score 2.2 above baseline; still in Green zone QUICK DASH SURVEY:   Lymphedema life impact scale: 25  TODAY'S TREATMENT:                                                                                                                                           DATE:  04/16/2023 Performed SOZO. Still in Green. Gave pt script to get compression sleeve at [facility], Pt wrapped by PTA(VAL) with stockinette, Artiflex, Elastomull to fingers, 6cm to hand, 10 cm with X and 12 cm wrist to axilla. Gave instructions for removing bandages.Educated pt to resume AAROM exs due to limitations in right shoulder ROM with tightness; pt performed supine AA flexion, and was reminded about wall slides for abduction.   PATIENT EDUCATION:  Education details: POC,script for garments, exercises, swelling Person educated: Patient Education method: Chief Technology Officer Education comprehension: verbalized understanding  HOME EXERCISE PROGRAM: AAROM for flexion and abd wall slides  ASSESSMENT:  CLINICAL IMPRESSION: Patient is a 47 y.o. female who was seen today for physical therapy evaluation and treatment for right UE swelling/lymphedema. She is s/p bilateral nipple Sparing Mastectomies  on 2/16/20233 with 0/4 LN's removed . She started experiencing pain and swelling in the right UE in June 2024. Right UE is larger than left approx 1 cm or slightly more throughout, however, her SOZO screen was still in the green. She has limitations in Right shoulder ROM due to tightness in right axillary region and she was instructed in exercises to start with. The right UE was wrapped while she was here since she wasn't working today. She was also given a script to get a compression sleeve/glove. She will benefit from skilled PT to address deficits and return to PLOF. Pt lives 1 hour and 10 minutes away so prefers more limited visits if possible. [facility] was offered, but she preferred to come her because she knows [place].   OBJECTIVE IMPAIRMENTS: decreased activity tolerance, decreased knowledge of condition, decreased ROM, decreased strength, increased edema, impaired UE functional use, postural dysfunction, and pain.   ACTIVITY LIMITATIONS: carrying, sleeping, and reach over  head  PARTICIPATION LIMITATIONS: cleaning and occupation  PERSONAL FACTORS: 1-2 comorbidities: right breast cancer,anxiety,  are also affecting patient's functional outcome.   REHAB POTENTIAL: Good  CLINICAL DECISION MAKING: Stable/uncomplicated  EVALUATION COMPLEXITY: Low  GOALS: Goals reviewed with patient? Yes  SHORT TERM GOALS=LONG TERM GOALS: Target date: 05/28/2023  Pt will have appropriate compression garments for management of swelling Baseline: Goal status: INITIAL  2.  Pt will be independent in HEP for right shoulder ROM and strengthening Baseline:  Goal status: INITIAL  3.  Pt will be independent with compression bandaging prn for right UE swelling Baseline:  Goal status: INITIAL  4.  Pt will be independent with self right UE MLD to decrease swelling Baseline:  Goal status: INITIAL  5.  Pts right UE pain will be decreased by 50-75% or more for improved home and work tasks Baseline:  Goal status: INITIAL  PLAN:  PT FREQUENCY: 1-2x/week  PT DURATION: 6 weeks  PLANNED INTERVENTIONS: Therapeutic exercises, Patient/Family education, Self Care, Orthotic/Fit training, Manual lymph drainage, Compression bandaging, Manual therapy, and Re-evaluation  PLAN FOR NEXT SESSION: Did pt get sleeve, was wrap comfortable, Wrap again, MLD to right UE and teach pt, continue shoulder ROM and progress to strength, Assess neck?  Waynette Buttery, PT 04/16/2023, 1:04 PM

## 2023-04-16 ENCOUNTER — Ambulatory Visit: Payer: BC Managed Care – PPO | Attending: Adult Health

## 2023-04-16 ENCOUNTER — Other Ambulatory Visit: Payer: Self-pay

## 2023-04-16 DIAGNOSIS — M25611 Stiffness of right shoulder, not elsewhere classified: Secondary | ICD-10-CM | POA: Diagnosis present

## 2023-04-16 DIAGNOSIS — C50311 Malignant neoplasm of lower-inner quadrant of right female breast: Secondary | ICD-10-CM | POA: Diagnosis not present

## 2023-04-16 DIAGNOSIS — M7989 Other specified soft tissue disorders: Secondary | ICD-10-CM | POA: Diagnosis not present

## 2023-04-16 DIAGNOSIS — Z170 Estrogen receptor positive status [ER+]: Secondary | ICD-10-CM | POA: Insufficient documentation

## 2023-04-16 DIAGNOSIS — I890 Lymphedema, not elsewhere classified: Secondary | ICD-10-CM | POA: Insufficient documentation

## 2023-04-16 DIAGNOSIS — Z483 Aftercare following surgery for neoplasm: Secondary | ICD-10-CM | POA: Diagnosis present

## 2023-04-16 DIAGNOSIS — M79601 Pain in right arm: Secondary | ICD-10-CM | POA: Diagnosis present

## 2023-04-16 NOTE — Patient Instructions (Signed)
PLEASE KEEP YOUR BANDAGES ON AS LONG AS POSSIBLE TO GET THE BEST SWELLING REDUCTION. ?Should your bandages become uncomfortable or feel too tight, follow these steps: ?Elevate your extremity higher than your heart.  ?Try to move your arm or leg joints against the firmness of the bandage to help with moving the fluid and allow the bandages to loosen a bit.  ?If the bandaging is still is too tight, it is ok to carefully remove the top layer.  There will still be more layers under it that can provide compression to your extremity. ?Finally, if you STILL have significant pain after trying these steps, it is ok to take the bandage off.  Check your skin carefully for any signs of irritation  ?PLEASE bring ALL bandage materials back to your next appointment as we will reuse what we can ?TAKE CARE OF YOUR BANDAGES SO THEY WILL LAST LONGER AND STAY IN BETTER CONDITION ?Washing bandages:  Wash periodically using a mild detergent in warm water.  Do not use fabric softener or bleach.  Place bandages in a mesh lingerie bag or in a tied off pillow case and use the gentle cycle of the washing machine or hand wash. If you hand wash, you may want to put them in the spin cycle of your washer to get the extra water out, but make sure you put them in a mesh bag first. Do not wring or stretch them while they are wet.  ?Drying bandages: Lay the bandages out smoothly on a towel away from direct sunlight or heating sources that can damage the fabric. Rolling bandages in a towel and gently squeezing the towel to remove excess water before laying them out can speed up the process.  If you use a drying rack, place a towel on top of the rack to lay the bandages on.  If they hang down to dry, they fabric could be stretched out and the bandage will lose its compression.   Or, keep bandages in the mesh bag and dry them in the dryer on the low or no heat cycle. ?Rolling bandages: Please roll your bandages after drying them so they are ready for  your next treatment. If they are rolled too loose, they will be difficult to apply.  If rolled too tight, they can get stretched out.  ? ?TAKE CARE OF YOUR SKIN ?Apply a low pH moisturizing lotion to your skin daily ?Avoid scratching your skin ?Treat skin irritations quickly  ?Know the 5 warning signs of infection: redness, pain, warmth to touch, fever and increased swelling.  Call your physician immediately if you notice any of these signs of a possible infection. ? ?

## 2023-04-19 ENCOUNTER — Ambulatory Visit: Payer: BC Managed Care – PPO

## 2023-04-19 DIAGNOSIS — Z483 Aftercare following surgery for neoplasm: Secondary | ICD-10-CM

## 2023-04-19 DIAGNOSIS — I890 Lymphedema, not elsewhere classified: Secondary | ICD-10-CM | POA: Diagnosis not present

## 2023-04-19 DIAGNOSIS — M79601 Pain in right arm: Secondary | ICD-10-CM

## 2023-04-19 DIAGNOSIS — M25611 Stiffness of right shoulder, not elsewhere classified: Secondary | ICD-10-CM

## 2023-04-19 NOTE — Therapy (Signed)
OUTPATIENT PHYSICAL THERAPY  UPPER EXTREMITY ONCOLOGY TREATMENT  Patient Name: Pamela [name] MRN: [id number] DOB:Apr 26, 1976, 47 y.o., female Today's Date: 04/19/2023  END OF SESSION:  PT End of Session - 04/19/23 0908     Visit Number 2    Number of Visits 12    Date for PT Re-Evaluation 05/28/23    PT Start Time 0908   late   PT Stop Time 0957    PT Time Calculation (min) 49 min    Activity Tolerance Patient tolerated treatment well    Behavior During Therapy [facility] for tasks assessed/performed             Past Medical History:  Diagnosis Date   BRCA1 gene mutation positive in female    Cancer [facility]) 2021   right   Family history of BRCA1 gene positive    Family history of breast cancer    Family history of colon cancer    Family history of prostate cancer    GERD (gastroesophageal reflux disease)    Pneumonia    2015 or 2016   PONV (postoperative nausea and vomiting)    Wears glasses    for reading   Past Surgical History:  Procedure Laterality Date   BREAST RECONSTRUCTION WITH PLACEMENT OF TISSUE EXPANDER AND ALLODERM Bilateral 10/12/2020   Procedure: BILATERAL BREAST RECONSTRUCTION WITH PLACEMENT OF TISSUE EXPANDER AND ALLODERM;  Surgeon: Glenna Fellows, MD;  Location: [facility];  Service: Plastics;  Laterality: Bilateral;   CERVICAL DISC SURGERY  2017   ruptured disc neck with prosthetic replacement   GANGLION CYST EXCISION Left 2019   left wrist yrs   LIPOSUCTION WITH LIPOFILLING Bilateral 01/06/2021   Procedure: LIPOFILLING FROM ABDOMEN TO BILATERAL CHEST;  Surgeon: Glenna Fellows, MD;  Location: [facility];  Service: Plastics;  Laterality: Bilateral;   NIPPLE SPARING MASTECTOMY WITH SENTINEL LYMPH NODE BIOPSY Bilateral 10/12/2020   Procedure: BILATERAL NIPPLE SPARING MASTECTOMY WITH RIGHT AXILLARY SENTINEL LYMPH NODE BIOPSY;  Surgeon: Emelia Loron, MD;  Location: [facility];  Service: General;   Laterality: Bilateral;  PEC BLOCK, RNFA   REMOVAL OF BILATERAL TISSUE EXPANDERS WITH PLACEMENT OF BILATERAL BREAST IMPLANTS Bilateral 01/06/2021   Procedure: REMOVAL OF BILATERAL TISSUE EXPANDERS WITH PLACEMENT OF BILATERAL BREAST SILICONE IMPLANTS;  Surgeon: Glenna Fellows, MD;  Location:  SURGERY CENTER;  Service: Plastics;  Laterality: Bilateral;   ROBOTIC ASSISTED TOTAL HYSTERECTOMY WITH BILATERAL SALPINGO OOPHERECTOMY Bilateral 02/22/2021   Procedure: XI ROBOTIC ASSISTED TOTAL HYSTERECTOMY WITH BILATERAL SALPINGO OOPHORECTOMY;  Surgeon: Adolphus Birchwood, MD;  Location: [facility];  Service: Gynecology;  Laterality: Bilateral;   TUBAL LIGATION     yrs ago   Patient Active Problem List   Diagnosis Date Noted   Breast cancer, right (HCC) 10/12/2020   History of cervical dysplasia 10/06/2020   Malignant neoplasm of lower-inner quadrant of right breast of female, estrogen receptor positive (HCC) 09/16/2020   BRCA1 gene mutation positive in female    Family history of BRCA1 gene positive    Family history of breast cancer    Family history of prostate cancer    Family history of colon cancer     PCP: Rubye Oaks, MD  REFERRING PROVIDER: Lillard Anes, NP  REFERRING DIAG: Right UE lymphedema  THERAPY DIAG:  Lymphedema, not elsewhere classified  Aftercare following surgery for neoplasm  Stiffness of right shoulder, not elsewhere classified  Pain in right arm  ONSET DATE: June 2024  Rationale for Evaluation and  Treatment: Rehabilitation  SUBJECTIVE:                                                                                                                                                                                           SUBJECTIVE STATEMENT:  04/19/2023 I was able to get my sleeve on Wednesday. My husband helped me get it on. I didn't get the glove but if my fingers swell I will.  I have been doing my exercises  EVAL I am having a lot  of discomfort/pain in my right arm. It starts at my neck and is worse at my elbow, but its not tender right now. It also wraps around under my arm to the shoulder blade area. It is really swollen. I live 1 hr and 10 minutes from here.  PERTINENT HISTORY:  10/12/20 bilateral nipple sparing mastectomy with R SLNB (4 nodes all negative) for treatment of R breast cancer that is ER/PR+,HER2-, BRCA 1 positive, She did not have chemo or radiation. She had a  total hysterectomy on 02/22/21 . She had a venous doppler on 03/07/2023 that was negative as well as a Chest CT that was negative.  PAIN: none today  PRECAUTIONS: Right UE lymphedema, SVT, anxiety  RED FLAGS: None   WEIGHT BEARING RESTRICTIONS: No  FALLS:  Has patient fallen in last 6 months? No  LIVING ENVIRONMENT: Lives with: lives with their family, husband, children , grandchildren Lives in: House/apartment   OCCUPATION: Nurse;[employer]  LEISURE: flowers  HAND DOMINANCE: left   PRIOR LEVEL OF FUNCTION: Independent  PATIENT GOALS: Relief of pain/swelling   OBJECTIVE:  COGNITION: Overall cognitive status: Within functional limits for tasks assessed   PALPATION: No tenderness presently  OBSERVATIONS / OTHER ASSESSMENTS: no cording noted., right lateral trunk swelling, mild thickening at axillary incision  SENSATION: Light touch: Deficits axilla,  POSTURE: forward head, rounded shoulders  UPPER EXTREMITY AROM/PROM:  A/PROM RIGHT   eval   Shoulder extension   Shoulder flexion 135  Shoulder abduction 154  Shoulder internal rotation   Shoulder external rotation     (Blank rows = not tested)  A/PROM LEFT   eval  Shoulder extension   Shoulder flexion 149  Shoulder abduction 159  Shoulder internal rotation   Shoulder external rotation     (Blank rows = not tested)  CERVICAL AROM: All within functional limits:     UPPER EXTREMITY STRENGTH:   LYMPHEDEMA ASSESSMENTS:   SURGERY TYPE/DATE: 10/12/20  bilateral nipple sparing mastectomy with R SLNB (4 nodes all negative) with immediate tissue expanders  NUMBER OF LYMPH NODES REMOVED: 0/4  CHEMOTHERAPY: NO  RADIATION:NO  HORMONE  TREATMENT: not taking  INFECTIONS: NO   LYMPHEDEMA ASSESSMENTS:   LANDMARK RIGHT  eval  At axilla  31.4  15 cm proximal to olecranon process 30  10 cm proximal to olecranon process 29.5  Olecranon process 27  15 cm proximal to ulnar styloid process 25.2  10 cm proximal to ulnar styloid process 21.5  Just proximal to ulnar styloid process 16.5  Across hand at thumb web space 18.8  At base of 2nd digit 5.8  (Blank rows = not tested)  LANDMARK LEFT  eval  At axilla  29.7  15 cm proximal to olecranon process 29  10 cm proximal to olecranon process 28.4  Olecranon process 26.5  15 cm proximal to ulnar styloid process 23.8  10 cm proximal to ulnar styloid process 20.1  Just proximal to ulnar styloid process 15.9  Across hand at thumb web space 18.4  At base of 2nd digit 5.9  (Blank rows = not tested)   FUNCTIONAL TESTS:    GAIT: Normal  L-DEX LYMPHEDEMA SCREENING: The patient was assessed using the L-Dex machine today to produce a lymphedema index baseline score. The patient will be reassessed on a regular basis (typically every 3 months) to obtain new L-Dex scores. If the score is > 6.5 points away from his/her baseline score indicating onset of subclinical lymphedema, it will be recommended to wear a compression garment for 4 weeks, 12 hours per day and then be reassessed. If the score continues to be > 6.5 points from baseline at reassessment, we will initiate lymphedema treatment. Assessing in this manner has a 95% rate of preventing clinically significant lymphedema. Score 2.2 above baseline; still in Green zone QUICK DASH SURVEY:   Lymphedema life impact scale: 25  TODAY'S TREATMENT:                                                                                                                                           DATE:   04/19/2023 Pulleys flexion and abduction x 2 min In supine: Short neck, 5 diaphragmatic breaths, L axillary nodes and establishment of interaxillary pathway, R inguinal nodes and establishment of axilloinguinal pathway, then R UE working proximal to distal, moving inner upper arm outwards and upwards, and doing both sides of forearms, spending extra time in any areas of fibrosis then retracing all steps and ending with LN's. Therapist performed all and had pt practice all. Pt instructed in donning compression sleeve. Showed rubber gloves; able to do very well Gave written instructions for MLD  04/16/2023 Performed SOZO. Still in Green. Gave pt script to get compression sleeve at [facility], Pt wrapped by PTA(VAL) with stockinette, Artiflex, Elastomull to fingers, 6cm to hand, 10 cm with X and 12 cm wrist to axilla. Gave instructions for removing bandages.Educated pt to resume AAROM exs due to limitations in right shoulder ROM with tightness; pt performed supine AA  flexion, and was reminded about wall slides for abduction.   PATIENT EDUCATION:  Education details: POC,script for garments, exercises, swelling Person educated: Patient Education method: Chief Technology Officer Education comprehension: verbalized understanding  HOME EXERCISE PROGRAM: AAROM for flexion and abd wall slides  ASSESSMENT:  CLINICAL IMPRESSION:  Pt got her sleeve and is now able to don independently. She was instructed in MLD to the right UE and did very well for the first day. She is going to wear her sleeve daily and do MLD daily. We will determine in the next week or so if she needs to bandage to reduce the UE.   OBJECTIVE IMPAIRMENTS: decreased activity tolerance, decreased knowledge of condition, decreased ROM, decreased strength, increased edema, impaired UE functional use, postural dysfunction, and pain.   ACTIVITY LIMITATIONS: carrying, sleeping, and reach over  head  PARTICIPATION LIMITATIONS: cleaning and occupation  PERSONAL FACTORS: 1-2 comorbidities: right breast cancer,anxiety,  are also affecting patient's functional outcome.   REHAB POTENTIAL: Good  CLINICAL DECISION MAKING: Stable/uncomplicated  EVALUATION COMPLEXITY: Low  GOALS: Goals reviewed with patient? Yes  SHORT TERM GOALS=LONG TERM GOALS: Target date: 05/28/2023  Pt will have appropriate compression garments for management of swelling Baseline: Goal status: INITIAL  2.  Pt will be independent in HEP for right shoulder ROM and strengthening Baseline:  Goal status: INITIAL  3.  Pt will be independent with compression bandaging prn for right UE swelling Baseline:  Goal status: INITIAL  4.  Pt will be independent with self right UE MLD to decrease swelling Baseline:  Goal status: INITIAL  5.  Pts right UE pain will be decreased by 50-75% or more for improved home and work tasks Baseline:  Goal status: INITIAL  PLAN:  PT FREQUENCY: 1-2x/week  PT DURATION: 6 weeks  PLANNED INTERVENTIONS: Therapeutic exercises, Patient/Family education, Self Care, Orthotic/Fit training, Manual lymph drainage, Compression bandaging, Manual therapy, and Re-evaluation  PLAN FOR NEXT SESSION: pt got sleeve, Do we need to  Wrap again (assess next week), MLD to right UE and teach pt, continue shoulder ROM and progress to strength, Assess neck?  Waynette Buttery, PT 04/19/2023, 9:58 AM

## 2023-04-30 ENCOUNTER — Ambulatory Visit: Payer: BC Managed Care – PPO | Admitting: Rehabilitation

## 2023-05-02 ENCOUNTER — Ambulatory Visit: Payer: BC Managed Care – PPO | Admitting: Rehabilitation

## 2023-05-06 ENCOUNTER — Ambulatory Visit: Payer: BC Managed Care – PPO | Attending: Adult Health | Admitting: Physical Therapy

## 2023-05-06 ENCOUNTER — Encounter: Payer: Self-pay | Admitting: Physical Therapy

## 2023-05-06 DIAGNOSIS — M25611 Stiffness of right shoulder, not elsewhere classified: Secondary | ICD-10-CM | POA: Diagnosis present

## 2023-05-06 DIAGNOSIS — M79601 Pain in right arm: Secondary | ICD-10-CM | POA: Insufficient documentation

## 2023-05-06 DIAGNOSIS — Z483 Aftercare following surgery for neoplasm: Secondary | ICD-10-CM | POA: Diagnosis present

## 2023-05-06 DIAGNOSIS — I890 Lymphedema, not elsewhere classified: Secondary | ICD-10-CM | POA: Insufficient documentation

## 2023-05-06 NOTE — Therapy (Signed)
OUTPATIENT PHYSICAL THERAPY  UPPER EXTREMITY ONCOLOGY TREATMENT  Patient Name: QUANTAVIA LAMOTTE MRN: [id number] DOB:1976/03/20, 47 y.o., female Today's Date: 05/06/2023  END OF SESSION:  PT End of Session - 05/06/23 1509     Visit Number 3    Number of Visits 12    Date for PT Re-Evaluation 05/28/23    PT Start Time 1507    PT Stop Time 1554    PT Time Calculation (min) 47 min    Activity Tolerance Patient tolerated treatment well    Behavior During Therapy [facility] for tasks assessed/performed             Past Medical History:  Diagnosis Date   BRCA1 gene mutation positive in female    Cancer [facility]) 2021   right   Family history of BRCA1 gene positive    Family history of breast cancer    Family history of colon cancer    Family history of prostate cancer    GERD (gastroesophageal reflux disease)    Pneumonia    2015 or 2016   PONV (postoperative nausea and vomiting)    Wears glasses    for reading   Past Surgical History:  Procedure Laterality Date   BREAST RECONSTRUCTION WITH PLACEMENT OF TISSUE EXPANDER AND ALLODERM Bilateral 10/12/2020   Procedure: BILATERAL BREAST RECONSTRUCTION WITH PLACEMENT OF TISSUE EXPANDER AND ALLODERM;  Surgeon: Glenna Fellows, MD;  Location: [facility];  Service: Plastics;  Laterality: Bilateral;   CERVICAL DISC SURGERY  2017   ruptured disc neck with prosthetic replacement   GANGLION CYST EXCISION Left 2019   left wrist yrs   LIPOSUCTION WITH LIPOFILLING Bilateral 01/06/2021   Procedure: LIPOFILLING FROM ABDOMEN TO BILATERAL CHEST;  Surgeon: Glenna Fellows, MD;  Location: [facility];  Service: Plastics;  Laterality: Bilateral;   NIPPLE SPARING MASTECTOMY WITH SENTINEL LYMPH NODE BIOPSY Bilateral 10/12/2020   Procedure: BILATERAL NIPPLE SPARING MASTECTOMY WITH RIGHT AXILLARY SENTINEL LYMPH NODE BIOPSY;  Surgeon: Emelia Loron, MD;  Location: [facility];  Service: General;  Laterality:  Bilateral;  PEC BLOCK, RNFA   REMOVAL OF BILATERAL TISSUE EXPANDERS WITH PLACEMENT OF BILATERAL BREAST IMPLANTS Bilateral 01/06/2021   Procedure: REMOVAL OF BILATERAL TISSUE EXPANDERS WITH PLACEMENT OF BILATERAL BREAST SILICONE IMPLANTS;  Surgeon: Glenna Fellows, MD;  Location: [facility];  Service: Plastics;  Laterality: Bilateral;   ROBOTIC ASSISTED TOTAL HYSTERECTOMY WITH BILATERAL SALPINGO OOPHERECTOMY Bilateral 02/22/2021   Procedure: XI ROBOTIC ASSISTED TOTAL HYSTERECTOMY WITH BILATERAL SALPINGO OOPHORECTOMY;  Surgeon: Adolphus Birchwood, MD;  Location: [facility];  Service: Gynecology;  Laterality: Bilateral;   TUBAL LIGATION     yrs ago   Patient Active Problem List   Diagnosis Date Noted   Breast cancer, right (HCC) 10/12/2020   History of cervical dysplasia 10/06/2020   Malignant neoplasm of lower-inner quadrant of right breast of female, estrogen receptor positive (HCC) 09/16/2020   BRCA1 gene mutation positive in female    Family history of BRCA1 gene positive    Family history of breast cancer    Family history of prostate cancer    Family history of colon cancer     PCP: Rubye Oaks, MD  REFERRING PROVIDER: Lillard Anes, NP  REFERRING DIAG: Right UE lymphedema  THERAPY DIAG:  Lymphedema, not elsewhere classified  Aftercare following surgery for neoplasm  Stiffness of right shoulder, not elsewhere classified  Pain in right arm  ONSET DATE: June 2024  Rationale for Evaluation and Treatment: Rehabilitation  SUBJECTIVE:                                                                                                                                                                                           SUBJECTIVE STATEMENT:  04/19/2023 I think the swelling in my arm is doing better.   EVAL I am having a lot of discomfort/pain in my right arm. It starts at my neck and is worse at my elbow, but its not tender right now. It also  wraps around under my arm to the shoulder blade area. It is really swollen. I live 1 hr and 10 minutes from here.  PERTINENT HISTORY:  10/12/20 bilateral nipple sparing mastectomy with R SLNB (4 nodes all negative) for treatment of R breast cancer that is ER/PR+,HER2-, BRCA 1 positive, She did not have chemo or radiation. She had a  total hysterectomy on 02/22/21 . She had a venous doppler on 03/07/2023 that was negative as well as a Chest CT that was negative.  PAIN: none today  PRECAUTIONS: Right UE lymphedema, SVT, anxiety  RED FLAGS: None   WEIGHT BEARING RESTRICTIONS: No  FALLS:  Has patient fallen in last 6 months? No  LIVING ENVIRONMENT: Lives with: lives with their family, husband, children , grandchildren Lives in: House/apartment   OCCUPATION: Nurse;[employer]  LEISURE: flowers  HAND DOMINANCE: left   PRIOR LEVEL OF FUNCTION: Independent  PATIENT GOALS: Relief of pain/swelling   OBJECTIVE:  COGNITION: Overall cognitive status: Within functional limits for tasks assessed   PALPATION: No tenderness presently  OBSERVATIONS / OTHER ASSESSMENTS: no cording noted., right lateral trunk swelling, mild thickening at axillary incision  SENSATION: Light touch: Deficits axilla,  POSTURE: forward head, rounded shoulders  UPPER EXTREMITY AROM/PROM:  A/PROM RIGHT   eval   Shoulder extension   Shoulder flexion 135  Shoulder abduction 154  Shoulder internal rotation   Shoulder external rotation     (Blank rows = not tested)  A/PROM LEFT   eval  Shoulder extension   Shoulder flexion 149  Shoulder abduction 159  Shoulder internal rotation   Shoulder external rotation     (Blank rows = not tested)  CERVICAL AROM: All within functional limits:     UPPER EXTREMITY STRENGTH:   LYMPHEDEMA ASSESSMENTS:   SURGERY TYPE/DATE: 10/12/20 bilateral nipple sparing mastectomy with R SLNB (4 nodes all negative) with immediate tissue expanders  NUMBER OF LYMPH  NODES REMOVED: 0/4  CHEMOTHERAPY: NO  RADIATION:NO  HORMONE TREATMENT: not taking  INFECTIONS: NO   LYMPHEDEMA ASSESSMENTS:   LANDMARK RIGHT  eval RIGHT  05/06/23  At axilla  31.4 31  15  cm proximal to olecranon process 30 29.5  10 cm proximal to olecranon process 29.5 28.5  Olecranon process 27 27.2  15 cm proximal to ulnar styloid process 25.2 24  10  cm proximal to ulnar styloid process 21.5 21  Just proximal to ulnar styloid process 16.5 16  Across hand at thumb web space 18.8 18.5  At base of 2nd digit 5.8 5.9  (Blank rows = not tested)  LANDMARK LEFT  eval  At axilla  29.7  15 cm proximal to olecranon process 29  10 cm proximal to olecranon process 28.4  Olecranon process 26.5  15 cm proximal to ulnar styloid process 23.8  10 cm proximal to ulnar styloid process 20.1  Just proximal to ulnar styloid process 15.9  Across hand at thumb web space 18.4  At base of 2nd digit 5.9  (Blank rows = not tested)   FUNCTIONAL TESTS:    GAIT: Normal  L-DEX LYMPHEDEMA SCREENING: The patient was assessed using the L-Dex machine today to produce a lymphedema index baseline score. The patient will be reassessed on a regular basis (typically every 3 months) to obtain new L-Dex scores. If the score is > 6.5 points away from his/her baseline score indicating onset of subclinical lymphedema, it will be recommended to wear a compression garment for 4 weeks, 12 hours per day and then be reassessed. If the score continues to be > 6.5 points from baseline at reassessment, we will initiate lymphedema treatment. Assessing in this manner has a 95% rate of preventing clinically significant lymphedema. Score 2.2 above baseline; still in Green zone QUICK DASH SURVEY:   Lymphedema life impact scale: 25  TODAY'S TREATMENT:                                                                                                                                          DATE:  05/06/2023 Remeasured  circumferences and all have decreased In supine: Short neck, 5 diaphragmatic breaths, L axillary nodes and establishment of interaxillary pathway, R inguinal nodes and establishment of axilloinguinal pathway, then R UE working proximal to distal, moving inner upper arm outwards and upwards, and doing both sides of forearms, spending extra time in any areas of fibrosis then retracing all steps and ending with LN's then to L sidelying to work posterior interaxillary pathway then back to supine retracing all steps.  04/19/2023 Pulleys flexion and abduction x 2 min In supine: Short neck, 5 diaphragmatic breaths, L axillary nodes and establishment of interaxillary pathway, R inguinal nodes and establishment of axilloinguinal pathway, then R UE working proximal to distal, moving inner upper arm outwards and upwards, and doing both sides of forearms, spending extra time in any areas of fibrosis then retracing all steps and ending with LN's. Therapist performed all and had pt practice all. Pt instructed in donning compression sleeve. Showed rubber gloves; able to do very well Gave written  instructions for MLD  04/16/2023 Performed SOZO. Still in Green. Gave pt script to get compression sleeve at [facility], Pt wrapped by PTA(VAL) with stockinette, Artiflex, Elastomull to fingers, 6cm to hand, 10 cm with X and 12 cm wrist to axilla. Gave instructions for removing bandages.Educated pt to resume AAROM exs due to limitations in right shoulder ROM with tightness; pt performed supine AA flexion, and was reminded about wall slides for abduction.   PATIENT EDUCATION:  Education details: POC,script for garments, exercises, swelling Person educated: Patient Education method: Chief Technology [employer] Education comprehension: verbalized understanding  HOME EXERCISE PROGRAM: AAROM for flexion and abd wall slides  ASSESSMENT:  CLINICAL IMPRESSION: Pt has been wearing her sleeve and reports her swelling is  doing better. Remeasured circumferences today and all were decreased except olecranon which was up just slightly. Pt has not been having any hand swelling while wearing the sleeve. She has been doing self MLD and feels independent with this. Will reassess circumferences in another 2 weeks and then may decrease to 1x/month to assess if pt is able to independently manage.   OBJECTIVE IMPAIRMENTS: decreased activity tolerance, decreased knowledge of condition, decreased ROM, decreased strength, increased edema, impaired UE functional use, postural dysfunction, and pain.   ACTIVITY LIMITATIONS: carrying, sleeping, and reach over head  PARTICIPATION LIMITATIONS: cleaning and occupation  PERSONAL FACTORS: 1-2 comorbidities: right breast cancer,anxiety,  are also affecting patient's functional outcome.   REHAB POTENTIAL: Good  CLINICAL DECISION MAKING: Stable/uncomplicated  EVALUATION COMPLEXITY: Low  GOALS: Goals reviewed with patient? Yes  SHORT TERM GOALS=LONG TERM GOALS: Target date: 05/28/2023  Pt will have appropriate compression garments for management of swelling Baseline: Goal status: INITIAL  2.  Pt will be independent in HEP for right shoulder ROM and strengthening Baseline:  Goal status: INITIAL  3.  Pt will be independent with compression bandaging prn for right UE swelling Baseline:  Goal status: INITIAL  4.  Pt will be independent with self right UE MLD to decrease swelling Baseline:  Goal status: INITIAL  5.  Pts right UE pain will be decreased by 50-75% or more for improved home and work tasks Baseline:  Goal status: INITIAL  PLAN:  PT FREQUENCY: 1-2x/week  PT DURATION: 6 weeks  PLANNED INTERVENTIONS: Therapeutic exercises, Patient/Family education, Self Care, Orthotic/Fit training, Manual lymph drainage, Compression bandaging, Manual therapy, and Re-evaluation  PLAN FOR NEXT SESSION: remeasure- if still down can decrease to 1x/month, MLD to right UE and  teach pt, continue shoulder ROM and progress to strength, Assess neck?  [facility] [place], PT 05/06/2023, 3:59 PM

## 2023-05-08 ENCOUNTER — Ambulatory Visit: Payer: BC Managed Care – PPO

## 2023-05-20 ENCOUNTER — Ambulatory Visit: Payer: BC Managed Care – PPO

## 2023-05-30 ENCOUNTER — Ambulatory Visit: Payer: BC Managed Care – PPO

## 2023-07-08 NOTE — Therapy (Addendum)
 OUTPATIENT PHYSICAL THERAPY  UPPER EXTREMITY ONCOLOGY TREATMENT  Patient Name: Pamela [name] MRN: [id number] DOB:[birth year], 47 y.o., female Today's Date: 07/09/2023  END OF SESSION:  PT End of Session - 07/09/23 1711     Visit Number 4    Number of Visits 12    Authorization Type none needed    PT Start Time 1500    PT Stop Time 1558    PT Time Calculation (min) 58 min    Activity Tolerance Patient tolerated treatment well    Behavior During Therapy WFL for tasks assessed/performed              Past Medical History:  Diagnosis Date   BRCA1 gene mutation positive in female    Cancer [facility]) 2021   right   Family history of BRCA1 gene positive    Family history of breast cancer    Family history of colon cancer    Family history of prostate cancer    GERD (gastroesophageal reflux disease)    Pneumonia    2015 or 2016   PONV (postoperative nausea and vomiting)    Wears glasses    for reading   Past Surgical History:  Procedure Laterality Date   BREAST RECONSTRUCTION WITH PLACEMENT OF TISSUE EXPANDER AND ALLODERM Bilateral 10/12/2020   Procedure: BILATERAL BREAST RECONSTRUCTION WITH PLACEMENT OF TISSUE EXPANDER AND ALLODERM;  Surgeon: Alger Infield, MD;  Location: [facility];  Service: Plastics;  Laterality: Bilateral;   CERVICAL DISC SURGERY  2017   ruptured disc neck with prosthetic replacement   GANGLION CYST EXCISION Left 2019   left wrist yrs   LIPOSUCTION WITH LIPOFILLING Bilateral 01/06/2021   Procedure: LIPOFILLING FROM ABDOMEN TO BILATERAL CHEST;  Surgeon: Alger Infield, MD;  Location: [facility];  Service: Plastics;  Laterality: Bilateral;   NIPPLE SPARING MASTECTOMY WITH SENTINEL LYMPH NODE BIOPSY Bilateral 10/12/2020   Procedure: BILATERAL NIPPLE SPARING MASTECTOMY WITH RIGHT AXILLARY SENTINEL LYMPH NODE BIOPSY;  Surgeon: Enid Harry, MD;  Location: [facility];  Service: General;  Laterality:  Bilateral;  PEC BLOCK, RNFA   REMOVAL OF BILATERAL TISSUE EXPANDERS WITH PLACEMENT OF BILATERAL BREAST IMPLANTS Bilateral 01/06/2021   Procedure: REMOVAL OF BILATERAL TISSUE EXPANDERS WITH PLACEMENT OF BILATERAL BREAST SILICONE IMPLANTS;  Surgeon: Alger Infield, MD;  Location: [facility];  Service: Plastics;  Laterality: Bilateral;   ROBOTIC ASSISTED TOTAL HYSTERECTOMY WITH BILATERAL SALPINGO OOPHERECTOMY Bilateral 02/22/2021   Procedure: XI ROBOTIC ASSISTED TOTAL HYSTERECTOMY WITH BILATERAL SALPINGO OOPHORECTOMY;  Surgeon: Alphonso Aschoff, MD;  Location: [facility];  Service: Gynecology;  Laterality: Bilateral;   TUBAL LIGATION     yrs ago   Patient Active Problem List   Diagnosis Date Noted   Breast cancer, right (HCC) 10/12/2020   History of cervical dysplasia 10/06/2020   Malignant neoplasm of lower-inner quadrant of right breast of female, estrogen receptor positive (HCC) 09/16/2020   BRCA1 gene mutation positive in female    Family history of BRCA1 gene positive    Family history of breast cancer    Family history of prostate cancer    Family history of colon cancer     PCP: Yolande Hench, MD  REFERRING PROVIDER: Alwin Baars, NP  REFERRING DIAG: Right UE lymphedema  THERAPY DIAG:  Lymphedema, not elsewhere classified  Aftercare following surgery for neoplasm  Stiffness of right shoulder, not elsewhere classified  Pain in right arm  ONSET DATE: June 2024  Rationale for Evaluation and Treatment: Rehabilitation  SUBJECTIVE:                                                                                                                                                                                           SUBJECTIVE STATEMENT:  I think I can tell a difference if I have the sleeve on or not.  I am maybe still a little sore in the shoulder and elbow.  More sore by the end of the week or if I don't wear my sleeve.    PERTINENT HISTORY:   10/12/20 bilateral nipple sparing mastectomy with R SLNB (4 nodes all negative) for treatment of R breast cancer that is ER/PR+,HER2-, BRCA 1 positive, She did not have chemo or radiation. She had a  total hysterectomy on 02/22/21 . She had a venous doppler on 03/07/2023 that was negative as well as a Chest CT that was negative.  PAIN:  Are you having pain? Yes - not currently at rest  NPRS scale: 6/10 at the worst  Pain location: right UE Pain orientation: Right  PAIN TYPE: aching and burning Pain description: intermittent   Aggravating factors: only with certain movements, sometimes overnight.   Relieving factors: not using arm, Tylenol  minimally  PRECAUTIONS: Right UE lymphedema, SVT, anxiety  RED FLAGS: None   WEIGHT BEARING RESTRICTIONS: No  FALLS:  Has patient fallen in last 6 months? No  LIVING ENVIRONMENT: Lives with: lives with their family, husband, children , grandchildren Lives in: House/apartment  OCCUPATION: Nurse;[employer]  LEISURE: flowers  HAND DOMINANCE: left   PRIOR LEVEL OF FUNCTION: Independent  PATIENT GOALS: Relief of pain/swelling   OBJECTIVE:  COGNITION: Overall cognitive status: Within functional limits for tasks assessed   PALPATION: No tenderness presently  OBSERVATIONS / OTHER ASSESSMENTS: no cording noted., right lateral trunk swelling, mild thickening at axillary incision  SENSATION: Light touch: Deficits axilla,  POSTURE: forward head, rounded shoulders  UPPER EXTREMITY AROM/PROM:  A/PROM RIGHT   eval  07/09/23  Shoulder extension    Shoulder flexion 135 149  Shoulder abduction 154 155  Shoulder internal rotation    Shoulder external rotation      (Blank rows = not tested)  A/PROM LEFT   eval  Shoulder extension   Shoulder flexion 149  Shoulder abduction 159  Shoulder internal rotation   Shoulder external rotation     (Blank rows = not tested)  CERVICAL AROM: All within functional limits:   UPPER EXTREMITY  STRENGTH:   LYMPHEDEMA ASSESSMENTS:  SURGERY TYPE/DATE: 10/12/20 bilateral nipple sparing mastectomy with R SLNB (4 nodes all negative) with immediate tissue expanders NUMBER OF LYMPH NODES REMOVED: 0/4 CHEMOTHERAPY: NO  RADIATION:NO HORMONE TREATMENT: not taking INFECTIONS: NO  LYMPHEDEMA ASSESSMENTS:   LANDMARK RIGHT  eval 07/09/23  At axilla  31.4   15 cm proximal to olecranon process 30 30  10  cm proximal to olecranon process 29.5 29.5  Olecranon process 27 27  15  cm proximal to ulnar styloid process 25.2 24.1  10 cm proximal to ulnar styloid process 21.5 20.8  Just proximal to ulnar styloid process 16.5 15.8  Across hand at thumb web space 18.8 19.0  At base of 2nd digit 5.8 6.0  (Blank rows = not tested)  LANDMARK LEFT  eval 07/09/23  At axilla  29.7   15 cm proximal to olecranon process 29 29.5  10 cm proximal to olecranon process 28.4 29.2  Olecranon process 26.5 27  15  cm proximal to ulnar styloid process 23.8 23.8  10 cm proximal to ulnar styloid process 20.1 19.5  Just proximal to ulnar styloid process 15.9 15.7  Across hand at thumb web space 18.4 18.4  At base of 2nd digit 5.9 6.2  (Blank rows = not tested)  GAIT: Normal  L-DEX LYMPHEDEMA SCREENING: Still in green, below baseline  L-DEX LYMPHEDEMA SCREENING Measurement Type: Unilateral L-DEX MEASUREMENT EXTREMITY: Upper Extremity POSITION : Standing DOMINANT SIDE: Right At Risk Side: Right BASELINE SCORE (UNILATERAL): 9.6 L-DEX SCORE (UNILATERAL): 5.6 VALUE CHANGE (UNILAT): -4   TODAY'S TREATMENT:                                                                                                                                          DATE:  07/09/23 Reassessed status and all measurements Redid SOZO Discussed excellent status and no signs of lymphedema Pt asked about pump but she will most likely not need one as she has no signs of active lymphedema.  Supine STM: Rt latissimus in supine and  sidelying with cocoa butter with +1 ttp here.  Gave link on cancer rehab PT self massage video Showed pt lat tennis ball release and open book - declined photos.   04/16/2023  Performed SOZO. Still in Green. Gave pt script to get compression sleeve at [facility], Pt wrapped by PTA(VAL) with stockinette, Artiflex, Elastomull to fingers, 6cm to hand, 10 cm with X and 12 cm wrist to axilla. Gave instructions for removing bandages.Educated pt to resume AAROM exs due to limitations in right shoulder ROM with tightness; pt performed supine AA flexion, and was reminded about wall slides for abduction.   PATIENT EDUCATION:  Education details: POC,script for garments, exercises, swelling Person educated: Patient Education method: Chief Technology Officer Education comprehension: verbalized understanding  HOME EXERCISE PROGRAM: AAROM for flexion and abd wall slides  ASSESSMENT:  CLINICAL IMPRESSION: Pt returns after performing self care for awhile and is doing very well.  She has no signs of active lymphedema and is below her baseline SOZO score.  She continues to wear her sleeve every  day but was educated that she could maybe wear it just for work and high activity.  She is looking forward to starting back to yoga.  She has tenderness and tightness in her Rt lat but overall no significant limitations.    OBJECTIVE IMPAIRMENTS: decreased activity tolerance, decreased knowledge of condition, decreased ROM, decreased strength, increased edema, impaired UE functional use, postural dysfunction, and pain.   ACTIVITY LIMITATIONS: carrying, sleeping, and reach over head  PARTICIPATION LIMITATIONS: cleaning and occupation  PERSONAL FACTORS: 1-2 comorbidities: right breast cancer,anxiety, are also affecting patient's functional outcome.   REHAB POTENTIAL: Good  CLINICAL DECISION MAKING: Stable/uncomplicated  EVALUATION COMPLEXITY: Low  GOALS: Goals reviewed with patient? Yes  SHORT TERM  GOALS=LONG TERM GOALS: Target date: 05/28/2023  Pt will have appropriate compression garments for management of swelling Baseline: Goal status: MET  2.  Pt will be independent in HEP for right shoulder ROM and strengthening Baseline:  Goal status: MET  3.  Pt will be independent with compression bandaging prn for right UE swelling Baseline:  Goal status: DEFERRED  4.  Pt will be independent with self right UE MLD to decrease swelling Baseline:  Goal status: INITIAL  5.  Pts right UE pain will be decreased by 50-75% or more for improved home and work tasks Baseline:  Goal status: MET  PLAN:  PT FREQUENCY: 1-2x/week  PT DURATION: 6 weeks  PLANNED INTERVENTIONS: Therapeutic exercises, Patient/Family education, Self Care, Orthotic/Fit training, Manual lymph drainage, Compression bandaging, Manual therapy, and Re-evaluation  PLAN FOR NEXT SESSION: as needed - SOZO only  Encarnacion Harris, PT 07/09/2023, 5:13 PM  PHYSICAL THERAPY DISCHARGE SUMMARY  Visits from Start of Care: 4  Current functional level related to goals / functional outcomes: Per above   Remaining deficits: Lymphedema risk    Education / Equipment: Per above  Plan: Patient agrees to discharge.  Patient is being discharged due to meeting the stated rehab goals.

## 2023-07-09 ENCOUNTER — Encounter: Payer: Self-pay | Admitting: Rehabilitation

## 2023-07-09 ENCOUNTER — Ambulatory Visit: Payer: BC Managed Care – PPO | Attending: Adult Health | Admitting: Rehabilitation

## 2023-07-09 DIAGNOSIS — M25611 Stiffness of right shoulder, not elsewhere classified: Secondary | ICD-10-CM | POA: Diagnosis present

## 2023-07-09 DIAGNOSIS — Z483 Aftercare following surgery for neoplasm: Secondary | ICD-10-CM | POA: Insufficient documentation

## 2023-07-09 DIAGNOSIS — I890 Lymphedema, not elsewhere classified: Secondary | ICD-10-CM | POA: Diagnosis present

## 2023-07-09 DIAGNOSIS — M79601 Pain in right arm: Secondary | ICD-10-CM | POA: Diagnosis present

## 2023-10-01 ENCOUNTER — Telehealth: Payer: Self-pay | Admitting: *Deleted

## 2023-10-01 MED ORDER — LETROZOLE 2.5 MG PO TABS: 2.5000 mg | ORAL_TABLET | Freq: Every day | ORAL | 1 refills | Status: DC

## 2023-10-01 NOTE — Telephone Encounter (Signed)
 Received call from pt stating she has been off Anastrozole  x3 months due to side effect of headaches.  Pt states she will be seen by MD in 1 month and requesting advice from MD if she should start another AI medication.  RN reviewed with MD and verbal orders received for pt to be prescribed Letrozole  2.5 mg p.o daily.  Prescription sent to pharmacy on file, pt educated and verbalized understanding.

## 2023-10-07 ENCOUNTER — Ambulatory Visit: Payer: BC Managed Care – PPO

## 2023-10-28 ENCOUNTER — Ambulatory Visit: Payer: Self-pay | Attending: Adult Health

## 2023-11-05 ENCOUNTER — Ambulatory Visit: Payer: BC Managed Care – PPO | Admitting: Hematology and Oncology

## 2023-11-06 ENCOUNTER — Inpatient Hospital Stay: Payer: Self-pay | Admitting: Hematology and Oncology

## 2023-11-06 NOTE — Assessment & Plan Note (Deleted)
 09/07/2020:Palpable right axillary mass.  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 Bilateral mastectomies and reconstruction: Rt axilla: Grade 1 , 0.7 cm IDC with DCIS, 0/4 LN Neg, Bil mastectomies: Benign , ER 95%, PR 95%, HER2 negative, Ki 67 15%  2. No need of Oncotype DX testing  3. Adjuvant antiestrogen therapy initially started on tamoxifen, switched to letrozole in 04/11/2021 after she had TAH/BSO, switched to anastrozole 06/27/2021 (Letrozole caused muscle aches and pains and hot flashes), stopped August 2023 for muscle pains (she recognized that it was her shoes) resume to 11/05/2022   TAH/BSO: 01/2021: No malignancy   Anastrozole toxicities:  Muscle aches: Got better with improved shoes Hot flashes: better on Effexor   Will order bone density test next month Breast cancer surveillance: No role of imaging studies since she had bilateral mastectomies 11/06/23: Chest wall examination no palpable lumps or nodules of concern 02/2023: CT Chest: Neg   Return to clinic in 1 year for follow-up

## 2024-05-14 ENCOUNTER — Inpatient Hospital Stay: Attending: Hematology and Oncology | Admitting: Hematology and Oncology

## 2024-05-14 DIAGNOSIS — Z79811 Long term (current) use of aromatase inhibitors: Secondary | ICD-10-CM | POA: Insufficient documentation

## 2024-05-14 DIAGNOSIS — C50311 Malignant neoplasm of lower-inner quadrant of right female breast: Secondary | ICD-10-CM | POA: Insufficient documentation

## 2024-05-14 DIAGNOSIS — Z17411 Hormone receptor positive with human epidermal growth factor receptor 2 negative status: Secondary | ICD-10-CM | POA: Insufficient documentation

## 2024-05-14 NOTE — Assessment & Plan Note (Deleted)
09/07/2020:Palpable right axillary mass.  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 Bilateral mastectomies and reconstruction: Rt axilla: Grade 1 , 0.7 cm IDC with DCIS, 0/4 LN Neg, Bil mastectomies: Benign , ER 95%, PR 95%, HER2 negative, Ki 67 15%  2. No need of Oncotype DX testing  3. Adjuvant antiestrogen therapy initially started on tamoxifen, switched to letrozole in 04/11/2021 after she had TAH/BSO, switched to anastrozole 06/27/2021 (Letrozole caused muscle aches and pains and hot flashes), stopped August 2023 for muscle pains (she recognized that it was her shoes) resume to 11/05/2022   TAH/BSO: 01/2021: No malignancy   Anastrozole toxicities:  Muscle aches: Got better with improved shoes Hot flashes: better on Effexor  Will order bone density test next month Breast cancer surveillance: No role of imaging studies since she had bilateral mastectomies Chest wall examination no palpable lumps or nodules of concern  Return to clinic in 1 year for follow-up

## 2024-05-25 ENCOUNTER — Inpatient Hospital Stay: Admitting: Hematology and Oncology

## 2024-05-25 VITALS — BP 110/72 | HR 60 | Temp 97.9°F | Resp 16 | Ht 67.0 in | Wt 178.3 lb

## 2024-05-25 DIAGNOSIS — Z17411 Hormone receptor positive with human epidermal growth factor receptor 2 negative status: Secondary | ICD-10-CM | POA: Diagnosis not present

## 2024-05-25 DIAGNOSIS — Z79811 Long term (current) use of aromatase inhibitors: Secondary | ICD-10-CM | POA: Diagnosis not present

## 2024-05-25 DIAGNOSIS — C50311 Malignant neoplasm of lower-inner quadrant of right female breast: Secondary | ICD-10-CM

## 2024-05-25 DIAGNOSIS — Z780 Asymptomatic menopausal state: Secondary | ICD-10-CM | POA: Diagnosis not present

## 2024-05-25 DIAGNOSIS — Z170 Estrogen receptor positive status [ER+]: Secondary | ICD-10-CM | POA: Diagnosis not present

## 2024-05-25 MED ORDER — FLUOXETINE HCL 40 MG PO CAPS: 40.0000 mg | ORAL_CAPSULE | Freq: Every day | ORAL | Status: AC

## 2024-05-25 MED ORDER — LETROZOLE 2.5 MG PO TABS: 2.5000 mg | ORAL_TABLET | Freq: Every day | ORAL | 3 refills | Status: AC

## 2024-05-25 NOTE — Progress Notes (Signed)
 Patient Care Team: Jacqulyne Jamee BRAVO, MD as PCP - General (Family Medicine) Arelia Filippo, MD as Consulting Physician (Plastic Surgery) Odean Potts, MD as Consulting Physician (Hematology and Oncology) Ebbie Cough, MD as Consulting Physician (General Surgery)  DIAGNOSIS:  Encounter Diagnosis  Name Primary?   Malignant neoplasm of lower-inner quadrant of right breast of female, estrogen receptor positive (HCC) Yes    SUMMARY OF ONCOLOGIC HISTORY: Oncology History  Malignant neoplasm of lower-inner quadrant of right breast of female, estrogen receptor positive (HCC)  09/07/2020 Initial Diagnosis   Palpable right axillary mass.  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    10/12/2020 Cancer Staging   Staging form: Breast, AJCC 8th Edition - Pathologic stage from 10/12/2020: Stage IA (pT1b, pN0, cM0, G1, ER+, PR+, HER2-) - Signed by Crawford Morna Pickle, NP on 02/13/2021 Stage prefix: Initial diagnosis Histologic grading system: 3 grade system   09/2020 -  Anti-estrogen oral therapy   Anastrozole    01/2021 Surgery   TAH/BSO-no malignancy     CHIEF COMPLIANT: Surveillance of breast cancer on letrozole  therapy  HISTORY OF PRESENT ILLNESS:   History of Present Illness Pamela [name] is a 48 year old female with breast cancer who presents with concerns about her chest and medication management.  She experiences sensations on the left side of her chest, described as 'moving, rolling or something'. She has not yet undergone an ultrasound to assess the position of her  breast implant, which was placed by Dr. Luisa. An appointment with Dr. Samapa is scheduled for further evaluation.  She has discontinued venlafaxine  and is currently taking fluoxetine  at a dose of 40 mg. She experiences muscle stiffness and joint discomfort, described as 'almost like she wants to cramp up'.  She is on letrozole , with a treatment duration being considered between five to seven years. Initially, she was not consistent with the medication but is now more compliant.     ALLERGIES:  is allergic to metronidazole, sulfamethoxazole-trimethoprim, baclofen, chlorhexidine, and wound dressing adhesive.  MEDICATIONS:  Current Outpatient Medications  Medication Sig Dispense Refill   FLUoxetine  (PROZAC ) 40 MG capsule Take 1 capsule (40 mg total) by mouth daily.     albuterol (VENTOLIN HFA) 108 (90 Base) MCG/ACT inhaler Inhale 2 puffs into the lungs.     esomeprazole (NEXIUM) 40 MG capsule Take 40 mg by mouth.     hydrOXYzine (ATARAX/VISTARIL) 25 MG tablet Take by mouth.     ibuprofen  (ADVIL ) 800 MG tablet Take 1 tablet (800 mg total) by mouth every 8 (eight) hours as needed for moderate pain. For AFTER surgery only 30 tablet 0   letrozole  (FEMARA ) 2.5 MG tablet Take 1 tablet (2.5 mg total) by mouth daily. 90 tablet 3   losartan-hydrochlorothiazide (HYZAAR) 50-12.5 MG tablet Take 1 tablet by mouth daily.     metoprolol succinate (TOPROL-XL) 25 MG 24 hr tablet Take 1 tablet by mouth at bedtime.     mirtazapine  (REMERON ) 7.5 MG tablet Take 7.5 mg by mouth at bedtime.     omeprazole (PRILOSEC) 20 MG capsule Take 20 mg by mouth  as needed.     ondansetron  (ZOFRAN ) 8 MG tablet Take 1 tablet (8 mg total) by mouth every 8 (eight) hours as needed for nausea or vomiting. For AFTER surgery 10 tablet 0   senna-docusate (SENOKOT-S) 8.6-50 MG tablet Take 2 tablets by mouth at bedtime. Do not take if having diarrhea 30 tablet 0   No current facility-administered medications for this visit.     PHYSICAL EXAMINATION: ECOG PERFORMANCE STATUS: 1 - Symptomatic but completely ambulatory  Vitals:   05/25/24 0932  BP: 110/72  Pulse: 60  Resp: 16  Temp: 97.9 F (36.6 C)  SpO2: 100%   Filed Weights   05/25/24 0932  Weight: 178 lb 4.8 oz (80.9 kg)    Physical Exam Bilateral implants.  No palpable lumps or nodules.  (exam performed in the presence of a chaperone)  LABORATORY DATA:  I have reviewed the data as listed    Latest Ref Rng & Units 02/20/2021    9:00 AM  CMP  Glucose 70 - 99 mg/dL 88   BUN 6 - 20 mg/dL 12   Creatinine 9.55 - 1.00 mg/dL 9.24   Sodium 864 - 854 mmol/L 137   Potassium 3.5 - 5.1 mmol/L 3.9   Chloride 98 - 111 mmol/L 106   CO2 22 - 32 mmol/L 24   Calcium 8.9 - 10.3 mg/dL 9.2     Lab Results  Component Value Date   WBC 4.7 02/20/2021   HGB 13.2 02/20/2021   HCT 40.3 02/20/2021   MCV 88.8 02/20/2021   PLT 269 02/20/2021    ASSESSMENT & PLAN:  Malignant neoplasm of lower-inner quadrant of right breast of female, estrogen receptor positive (HCC) 09/07/2020:Palpable right axillary mass.  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 Bilateral mastectomies and reconstruction: Rt axilla: Grade 1 , 0.7 cm IDC with DCIS, 0/4 LN Neg, Bil mastectomies: Benign , ER 95%, PR 95%, HER2 negative, Ki 67 15%  2. No need of Oncotype DX testing  3. Adjuvant antiestrogen therapy initially started on tamoxifen , switched to letrozole  in 04/11/2021 after she had TAH/BSO, switched to anastrozole  06/27/2021 (Letrozole  caused muscle aches and pains and hot flashes), stopped August 2023 for muscle pains (she recognized that it was her shoes) resume to 11/05/2022   TAH/BSO: 01/2021: No malignancy   Anastrozole  toxicities:  Muscle aches: Along with cramps.  Encouraged to take magnesium supplement. Hot  flashes: better on Effexor   Breast cancer surveillance:  No role of imaging studies since she had bilateral mastectomies 05/25/2024: Chest wall examination no palpable lumps or nodules of concern   I ordered another bone density test to be done at [facility] on [address]. Return to clinic in 1 year for follow-up   No orders of the defined types were placed in this encounter.  The patient has a good understanding of the overall plan. she agrees with it. she will call with any problems that may develop before the next visit here. Total time spent: 30 mins including face to face time and time spent for planning, charting and co-ordination of care   Naomi MARLA Chad, MD 05/25/24

## 2024-05-25 NOTE — Assessment & Plan Note (Signed)
 09/07/2020:Palpable right axillary mass.  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 Bilateral mastectomies and reconstruction: Rt axilla: Grade 1 , 0.7 cm IDC with DCIS, 0/4 LN Neg, Bil mastectomies: Benign , ER 95%, PR 95%, HER2 negative, Ki 67 15%  2. No need of Oncotype DX testing  3. Adjuvant antiestrogen therapy initially started on tamoxifen , switched to letrozole  in 04/11/2021 after she had TAH/BSO, switched to anastrozole  06/27/2021 (Letrozole  caused muscle aches and pains and hot flashes), stopped August 2023 for muscle pains (she recognized that it was her shoes) resume to 11/05/2022   TAH/BSO: 01/2021: No malignancy   Anastrozole  toxicities:  Muscle aches: Got better with improved shoes Hot flashes: better on Effexor   Breast cancer surveillance:  No role of imaging studies since she had bilateral mastectomies 05/25/2024: Chest wall examination no palpable lumps or nodules of concern   Return to clinic in 1 year for follow-up

## 2025-05-25 ENCOUNTER — Ambulatory Visit: Admitting: Hematology and Oncology
